# Patient Record
Sex: Female | Born: 1960 | Race: Black or African American | Hispanic: No | Marital: Single | Smoking: Never smoker
Health system: Southern US, Community
[De-identification: ages and names within clinical notes are randomized; demographics above are authoritative.]

## PROBLEM LIST (undated history)

## (undated) HISTORY — PX: ABLATION: SHX5711

## (undated) HISTORY — PX: HEMORRHOID SURGERY: SHX153

---

## 1998-12-06 ENCOUNTER — Other Ambulatory Visit: Admission: RE | Admit: 1998-12-06 | Discharge: 1998-12-06 | Payer: Self-pay | Admitting: Obstetrics and Gynecology

## 2002-01-07 ENCOUNTER — Inpatient Hospital Stay (HOSPITAL_COMMUNITY): Admission: AD | Admit: 2002-01-07 | Discharge: 2002-01-09 | Payer: Self-pay | Admitting: Internal Medicine

## 2002-02-05 ENCOUNTER — Other Ambulatory Visit: Admission: RE | Admit: 2002-02-05 | Discharge: 2002-02-05 | Payer: Self-pay | Admitting: Obstetrics & Gynecology

## 2003-01-29 ENCOUNTER — Emergency Department (HOSPITAL_COMMUNITY): Admission: EM | Admit: 2003-01-29 | Discharge: 2003-01-29 | Payer: Self-pay | Admitting: Emergency Medicine

## 2003-02-04 ENCOUNTER — Other Ambulatory Visit: Admission: RE | Admit: 2003-02-04 | Discharge: 2003-02-04 | Payer: Self-pay | Admitting: Obstetrics and Gynecology

## 2004-04-19 ENCOUNTER — Other Ambulatory Visit: Admission: RE | Admit: 2004-04-19 | Discharge: 2004-04-19 | Payer: Self-pay | Admitting: Obstetrics and Gynecology

## 2006-11-22 ENCOUNTER — Encounter (INDEPENDENT_AMBULATORY_CARE_PROVIDER_SITE_OTHER): Payer: Self-pay | Admitting: Obstetrics and Gynecology

## 2006-11-22 ENCOUNTER — Ambulatory Visit (HOSPITAL_COMMUNITY): Admission: RE | Admit: 2006-11-22 | Discharge: 2006-11-22 | Payer: Self-pay | Admitting: Obstetrics and Gynecology

## 2008-09-23 ENCOUNTER — Encounter: Admission: RE | Admit: 2008-09-23 | Discharge: 2008-09-23 | Payer: Self-pay | Admitting: Obstetrics and Gynecology

## 2009-04-20 ENCOUNTER — Ambulatory Visit (HOSPITAL_BASED_OUTPATIENT_CLINIC_OR_DEPARTMENT_OTHER): Admission: RE | Admit: 2009-04-20 | Discharge: 2009-04-20 | Payer: Self-pay | Admitting: General Surgery

## 2010-04-11 ENCOUNTER — Encounter: Payer: Self-pay | Admitting: Obstetrics and Gynecology

## 2010-06-09 LAB — DIFFERENTIAL
Basophils Absolute: 0.1 10*3/uL (ref 0.0–0.1)
Basophils Relative: 2 % — ABNORMAL HIGH (ref 0–1)
Eosinophils Absolute: 0.2 10*3/uL (ref 0.0–0.7)
Eosinophils Relative: 2 % (ref 0–5)
Lymphocytes Relative: 19 % (ref 12–46)
Lymphs Abs: 1.6 10*3/uL (ref 0.7–4.0)
Monocytes Absolute: 0.6 10*3/uL (ref 0.1–1.0)
Monocytes Relative: 6 % (ref 3–12)
Neutro Abs: 6.2 10*3/uL (ref 1.7–7.7)
Neutrophils Relative %: 71 % (ref 43–77)

## 2010-06-09 LAB — CBC
HCT: 38.9 % (ref 36.0–46.0)
Hemoglobin: 13.3 g/dL (ref 12.0–15.0)
MCHC: 34.1 g/dL (ref 30.0–36.0)
MCV: 90.7 fL (ref 78.0–100.0)
Platelets: 197 10*3/uL (ref 150–400)
RBC: 4.29 MIL/uL (ref 3.87–5.11)
RDW: 12.9 % (ref 11.5–15.5)
WBC: 8.7 10*3/uL (ref 4.0–10.5)

## 2010-06-09 LAB — POCT PREGNANCY, URINE: Preg Test, Ur: NEGATIVE

## 2010-08-02 NOTE — Op Note (Signed)
Hailey Henry, [place] NO.:  [id number]   MEDICAL RECORD NO.:  [id number]          PATIENT TYPE:  AMB   LOCATION:  SDC                           FACILITY:  [facility]   PHYSICIAN:  Malva Limes, M.D.    DATE OF BIRTH:  [birth year]   DATE OF PROCEDURE:  11/22/2006  DATE OF DISCHARGE:                               OPERATIVE REPORT   PREOPERATIVE DIAGNOSIS:  Menorrhagia.   POSTOPERATIVE DIAGNOSIS:  Menorrhagia.   PROCEDURE:  1. Dilatation and curettage.  2. NovaSure endometrial ablation.   SURGEON:  Malva Limes, M.D.   ANESTHESIA:  General.   ANTIBIOTICS:  Ancef 1 gram.   DRAINS:  Straight cath to bladder.   ESTIMATED BLOOD LOSS:  Minimal.   SPECIMEN:  Endometrial curetting sent to Pathology.   COMPLICATIONS:  None.   ESTIMATED BLOOD LOSS:  Minimal.   PROCEDURE IN DETAIL:  The patient was taken to the operating room where  she was placed in the dorsal supine position and a general anesthetic  was administered without complication.  She was then placed in the  dorsal lithotomy position, she was prepped with Betadine and draped in  the usual fashion for this procedure.  Her bladder was drained with a  red rubber catheter.  The patient had an exam under anesthesia which  revealed a normal size uterus that was anteverted.  A sterile speculum  was placed in the vagina and 20 mL of 1% lidocaine was used for a  paracervical block.  The cervix was grasped with a single tooth  tenaculum and dilated to a 27-French.  The uterus was sounded to 8 cm.  The endocervical length was measured at 2 cm giving an intracavitary  length of 6 cm.  At this point sharp curettage was performed, the  NovaSure device was then placed into the uterine cavity and opened.  The  width was 4 cm.  At this point the seal test was performed and passed.  The device was then turned on for a total of 2  minutes at 90 watts.  This concluded the procedure.  The device was  removed.  The patient  tolerated the procedure well.  She will be  discharged to home.  She was sent home with Percocet to take p.r.n.  She  will follow-up in the office in 4 weeks.           ______________________________  Malva Limes, M.D.     MA/MEDQ  D:  11/22/2006  T:  11/22/2006  Job:  [id number]

## 2010-08-05 NOTE — Discharge Summary (Signed)
   Hailey Henry, Hailey Henry                         ACCOUNT NO.:  [id number]   MEDICAL RECORD NO.:  [id number]                   PATIENT TYPE:  INP   LOCATION:  9120                                 FACILITY:  [facility]   PHYSICIAN:  Gerrit Friends. Aldona Bar, M.D.                DATE OF BIRTH:  Jul 11, 1960   DATE OF ADMISSION:  01/07/2002  DATE OF DISCHARGE:  01/09/2002                                 DISCHARGE SUMMARY   DISCHARGE DIAGNOSES:  1. Term pregnancy, delivered 8 pound 6 ounce female infant, Apgars 8 and 9.  2. Blood type O positive.  3. Positive group B strep antenatally.  4. Advanced maternal age - amniocentesis at 16 weeks - normal.  5. Iron deficiency anemia.   PROCEDURES:  1. Normal spontaneous delivery.  2. Second degree tear and repair.   SUMMARY:  This 50 year old gravida 2 para 1 with a due date of December 31, 2001 presented in labor after a pregnancy complicated by advanced maternal  age (normal amniocentesis at [redacted] weeks gestation) and a history of iron  deficiency anemia.  She also was positive for group B strep antenatally.   She was admitted, had a normal course of labor, requested and received an  epidural, and had a subsequent normal spontaneous delivery of a viable  female 8 pound 6 ounce female infant with Apgars of 8 and 9 over a second  degree tear which was repaired without difficulty.  There was some lightly  meconium stained amniotic fluid noted.  The baby was DeLee'd on the  perineum.  The patient's postpartum course was normal.  She was  breastfeeding without difficulty.  Discharge hemoglobin 10.6 with a white  count of 14,100 and a platelet count of 187,000.  On the morning of October  23 she was ambulating well, tolerating a regular diet well, having normal  bowel and bladder function, was afebrile.  Her breastfeeding was going well.  She was tolerating a regular diet well, vital signs were stable, and she was  ready for discharge.  Accordingly, she was given all  appropriate  instructions and understood all instructions well.   DISCHARGE MEDICATIONS:  1. Vitamins - one a day.  2. Ferrous sulfate 300 mg daily.  3. Motrin 600 mg q.6h. p.r.n. for pain.   FOLLOW-UP:  She will return to the office for follow-up in approximately  four weeks time.   CONDITION ON DISCHARGE:  Improved.                                               Gerrit Friends. Aldona Bar, M.D.    RMW/MEDQ  D:  01/09/2002  T:  01/09/2002  Job:  [id number]

## 2010-09-19 ENCOUNTER — Other Ambulatory Visit: Payer: Self-pay | Admitting: Obstetrics and Gynecology

## 2010-12-05 ENCOUNTER — Other Ambulatory Visit: Payer: Self-pay | Admitting: Obstetrics and Gynecology

## 2010-12-30 LAB — CBC
HCT: 26.6 — ABNORMAL LOW
Hemoglobin: 8.5 — ABNORMAL LOW
MCHC: 31.9
MCV: 65.7 — ABNORMAL LOW
Platelets: 300
RBC: 4.04
RDW: 19.4 — ABNORMAL HIGH
WBC: 6.1

## 2010-12-30 LAB — PREGNANCY, URINE: Preg Test, Ur: NEGATIVE

## 2012-12-31 ENCOUNTER — Other Ambulatory Visit: Payer: Self-pay | Admitting: Obstetrics and Gynecology

## 2014-02-03 ENCOUNTER — Other Ambulatory Visit: Payer: Self-pay | Admitting: Obstetrics and Gynecology

## 2014-02-04 LAB — CYTOLOGY - PAP

## 2014-11-14 ENCOUNTER — Observation Stay (HOSPITAL_COMMUNITY)
Admission: EM | Admit: 2014-11-14 | Discharge: 2014-11-15 | Disposition: A | Discharge status: Home / Self Care | Payer: BLUE CROSS/BLUE SHIELD | Attending: Internal Medicine | Admitting: Internal Medicine

## 2014-11-14 ENCOUNTER — Encounter (HOSPITAL_COMMUNITY): Payer: Self-pay | Admitting: Emergency Medicine

## 2014-11-14 ENCOUNTER — Emergency Department (HOSPITAL_COMMUNITY): Payer: BLUE CROSS/BLUE SHIELD

## 2014-11-14 DIAGNOSIS — Z8249 Family history of ischemic heart disease and other diseases of the circulatory system: Secondary | ICD-10-CM | POA: Insufficient documentation

## 2014-11-14 DIAGNOSIS — E785 Hyperlipidemia, unspecified: Secondary | ICD-10-CM | POA: Insufficient documentation

## 2014-11-14 DIAGNOSIS — R0789 Other chest pain: Secondary | ICD-10-CM | POA: Diagnosis not present

## 2014-11-14 DIAGNOSIS — R739 Hyperglycemia, unspecified: Secondary | ICD-10-CM | POA: Insufficient documentation

## 2014-11-14 DIAGNOSIS — R079 Chest pain, unspecified: Principal | ICD-10-CM | POA: Diagnosis present

## 2014-11-14 DIAGNOSIS — R6884 Jaw pain: Secondary | ICD-10-CM | POA: Diagnosis present

## 2014-11-14 DIAGNOSIS — Z6836 Body mass index (BMI) 36.0-36.9, adult: Secondary | ICD-10-CM | POA: Diagnosis not present

## 2014-11-14 DIAGNOSIS — E668 Other obesity: Secondary | ICD-10-CM | POA: Insufficient documentation

## 2014-11-14 LAB — BASIC METABOLIC PANEL
Anion gap: 5 (ref 5–15)
BUN: 10 mg/dL (ref 6–20)
CHLORIDE: 108 mmol/L (ref 101–111)
CO2: 26 mmol/L (ref 22–32)
Calcium: 9.1 mg/dL (ref 8.9–10.3)
Creatinine, Ser: 0.63 mg/dL (ref 0.44–1.00)
GFR calc Af Amer: >60 mL/min (ref >60)
Glucose, Bld: 85 mg/dL (ref 65–99)
POTASSIUM: 3.8 mmol/L (ref 3.5–5.1)
Sodium: 139 mmol/L (ref 135–145)

## 2014-11-14 LAB — CBC
HCT: 38.8 % (ref 36.0–46.0)
Hemoglobin: 12.8 g/dL (ref 12.0–15.0)
MCH: 29.8 pg (ref 26.0–34.0)
MCHC: 33 g/dL (ref 30.0–36.0)
MCV: 90.2 fL (ref 78.0–100.0)
Platelets: 257 10*3/uL (ref 150–400)
RBC: 4.3 MIL/uL (ref 3.87–5.11)
RDW: 13.7 % (ref 11.5–15.5)
WBC: 6.3 10*3/uL (ref 4.0–10.5)

## 2014-11-14 LAB — TROPONIN I

## 2014-11-14 LAB — I-STAT TROPONIN, ED: Troponin i, poc: 0 ng/mL (ref 0.00–0.08)

## 2014-11-14 MED ORDER — ADULT MULTIVITAMIN W/MINERALS CH
1.0000 | ORAL_TABLET | Freq: Every day | ORAL | Status: DC
Start: 2014-11-14 — End: 2014-11-15
  Administered 2014-11-14 – 2014-11-15 (×2): 1 via ORAL
  Filled 2014-11-14 (×2): qty 1

## 2014-11-14 MED ORDER — GI COCKTAIL ~~LOC~~: 30.0000 mL | Freq: Four times a day (QID) | ORAL | Status: DC | PRN

## 2014-11-14 MED ORDER — ONDANSETRON HCL 4 MG/2ML IJ SOLN
4.0000 mg | Freq: Four times a day (QID) | INTRAMUSCULAR | Status: DC | PRN
  Administered 2014-11-15: 4 mg via INTRAVENOUS

## 2014-11-14 MED ORDER — ASPIRIN EC 325 MG PO TBEC
325.0000 mg | DELAYED_RELEASE_TABLET | Freq: Every day | ORAL | Status: DC
  Administered 2014-11-14 – 2014-11-15 (×2): 325 mg via ORAL
  Filled 2014-11-14 (×2): qty 1

## 2014-11-14 MED ORDER — ACETAMINOPHEN 325 MG PO TABS
650.0000 mg | ORAL_TABLET | ORAL | Status: DC | PRN
  Administered 2014-11-15: 650 mg via ORAL

## 2014-11-14 MED ORDER — MORPHINE SULFATE (PF) 2 MG/ML IV SOLN: 2.0000 mg | INTRAVENOUS | Status: DC | PRN

## 2014-11-14 MED ORDER — ONDANSETRON HCL 4 MG/2ML IJ SOLN: 4.0000 mg | Freq: Three times a day (TID) | INTRAMUSCULAR | Status: AC | PRN

## 2014-11-14 MED ORDER — NITROGLYCERIN 0.4 MG SL SUBL: 0.4000 mg | SUBLINGUAL_TABLET | SUBLINGUAL | Status: DC | PRN

## 2014-11-14 MED ORDER — ENOXAPARIN SODIUM 40 MG/0.4ML ~~LOC~~ SOLN
40.0000 mg | SUBCUTANEOUS | Status: DC
  Administered 2014-11-14: 40 mg via SUBCUTANEOUS
  Filled 2014-11-14: qty 0.4

## 2014-11-14 NOTE — H&P (Signed)
History and Physical        Hospital Admission Note Date: 11/14/2014  Patient name: Hailey Henry Medical record number: [id number] Date of birth: [birth year] Age: 54 y.o. Gender: female  PCP: No primary care provider on file.  Referring physician: Dr Hyacinth Meeker  Chief Complaint:  Chest pain radiating to the jaw and the left arm x2 days  HPI:  Patient is a 54 year old female with no significant past medical problems on no daily prescription medications presented with midsternal chest pain radiating to the jaw and the left arm since yesterday. History was obtained from the patient who reported that she was walking in the [facility] parking lot yesterday after visiting her mother (who is receiving chemotherapy there) when she noticed midsternal chest pain, described as heaviness, radiating to her jaw and left arm. She also noticed heaviness in her left arm with numbness and tingling with the chest pain. Patient however drove home and the chest pain was spontaneously relieved. This morning while she was driving to the [facility] again to visit her mother, she noticed the symptoms returning and she came back to the [facility] ED for evaluation. During the encounter, patient mentioned that she has noticed intermittent chest heaviness since January, sometimes at rest, at times with exertion, spontaneously resolves. She denied any symptoms of belching or burping or any acid reflux. Patient had no prior cardiac workup done. She denied any coughing, shortness of breath, palpitations or diaphoresis, nausea or vomiting.  Patient reported history of massive heart attack in her aunt and uncle (high father's brother and sister) in their 56s, father had stroke. At the time of my evaluation, chest pain had resolved. Troponins negative so far, EKG did not show acute ST-T wave changes suggestive  of ischemia    Review of Systems:  Constitutional: Denies fever, chills, diaphoresis, poor appetite and fatigue.  HEENT: Denies photophobia, eye pain, redness, hearing loss, ear pain, congestion, sore throat, rhinorrhea, sneezing, mouth sores, trouble swallowing, neck pain, neck stiffness and tinnitus.   Respiratory: Denies SOB, DOE, cough, and wheezing.   Cardiovascular: Please see history of present illness Gastrointestinal: Denies nausea, vomiting, abdominal pain, diarrhea, constipation, blood in stool and abdominal distention.  Genitourinary: Denies dysuria, urgency, frequency, hematuria, flank pain and difficulty urinating.  Musculoskeletal: Denies myalgias, back pain, joint swelling, arthralgias and gait problem.  Skin: Denies pallor, rash and wound.  Neurological: Denies dizziness, seizures, syncope, weakness, light-headedness, numbness and headaches.  Hematological: Denies adenopathy. Easy bruising, personal or family bleeding history  Psychiatric/Behavioral: Denies suicidal ideation, mood changes, confusion, nervousness, sleep disturbance and agitation  Past Medical History: History reviewed. No pertinent past medical history.  Past Surgical History  Procedure Laterality Date  . Hemorrhoid surgery    . Ablation      Medications: Prior to Admission medications   Medication Sig Start Date End Date Taking? Authorizing Provider  Multiple Vitamin (MULTIVITAMIN WITH MINERALS) TABS tablet Take 1 tablet by mouth daily.   Yes Historical Provider, MD    Allergies:   Allergies  Allergen Reactions  . Shellfish Allergy Anaphylaxis    Social History:  reports that she has never smoked. She does not have any  smokeless tobacco history on file. She reports that she does not drink alcohol. Her drug history is not on file.  Family History: Patient reports that her aunt (father's sister), uncle (father's brother) but had a massive heart attack in their 28s.  Her father had the stroke  at the age of 42  Physical Exam: Blood pressure 129/76, pulse 67, temperature 98.2 F (36.8 C), temperature source Oral, resp. rate 18, height 5\' 9"  (1.753 m), weight 113.399 kg (250 lb), SpO2 100 %. General: Alert, awake, oriented x3, in no acute distress. HEENT: normocephalic, atraumatic, anicteric sclera, pink conjunctiva, pupils equal and reactive to light and accomodation, oropharynx clear Neck: supple, no masses or lymphadenopathy, no goiter, no bruits  Heart: Regular rate and rhythm, without murmurs, rubs or gallops. No chest wall tenderness. Lungs: Clear to auscultation bilaterally, no wheezing, rales or rhonchi. Abdomen: Soft, nontender, nondistended, positive bowel sounds, no masses. Extremities: No clubbing, cyanosis or edema with positive pedal pulses. Neuro: Grossly intact, no focal neurological deficits, strength 5/5 upper and lower extremities bilaterally Psych: alert and oriented x 3, normal mood and affect Skin: no rashes or lesions, warm and dry   LABS on Admission:  Basic Metabolic Panel:  Recent Labs Lab 11/14/14 1400  NA 139  K 3.8  CL 108  CO2 26  GLUCOSE 85  BUN 10  CREATININE 0.63  CALCIUM 9.1   Liver Function Tests: No results for input(s): AST, ALT, ALKPHOS, BILITOT, PROT, ALBUMIN in the last 168 hours. No results for input(s): LIPASE, AMYLASE in the last 168 hours. No results for input(s): AMMONIA in the last 168 hours. CBC:  Recent Labs Lab 11/14/14 1400  WBC 6.3  HGB 12.8  HCT 38.8  MCV 90.2  PLT 257   Cardiac Enzymes: No results for input(s): CKTOTAL, CKMB, CKMBINDEX, TROPONINI in the last 168 hours. BNP: Invalid input(s): POCBNP CBG: No results for input(s): GLUCAP in the last 168 hours.  Radiological Exams on Admission:  Dg Chest 2 View  11/14/2014   CLINICAL DATA:  Left side jaw pain occasional earlier this week. Left arm pain and numbness since last night. Chest pressure this week.  EXAM: CHEST  2 VIEW  COMPARISON:  None.   FINDINGS: Heart is upper limits normal in size. Lungs are clear. No effusions. Rightward scoliosis in the thoracic spine. No acute bony abnormality.  IMPRESSION: No active cardiopulmonary disease.   Electronically Signed   By: Charlett Nose M.D.   On: 11/14/2014 14:20    *I have personally reviewed the images above*  EKG: Independently reviewed. Rate 70, RSR in pattern, left atrial enlargement, no acute ST-T wave changes suggestive of ischemia   Assessment/Plan Principal Problem:   Atypical chest pain: No history of hypertension, hyperlipidemia or diabetes, although has a strong family history of CAD. Nonsmoker. No prior cardiac workup.  - Admit to telemetry for observation - Obtain serial cardiac enzymes, lipid panel - Placed on aspirin, nitroglycerin sublingual as needed.  - Discussed in detail with cardiology fellow on call, Dr Loney Loh, recommended to obtain stress echo with the treadmill tomorrow. If negative, will DC home. NPO after midnight.   DVT prophylaxis: Lovenox  CODE STATUS: Full code  Family Communication: Admission, patients condition and plan of care including tests being ordered have been discussed with the patient  who indicates understanding and agree with the plan and Code Status  Disposition plan: Further plan will depend as patient's clinical course evolves and further radiologic and laboratory data become available.  Time Spent on Admission:   Jobani Sabado M.D. [facility] 11/14/2014, 5:11 PM Pager: [phone]  If 7PM-7AM, please contact night-coverage [email or web address] Password [email or web address]

## 2014-11-14 NOTE — ED Provider Notes (Signed)
CSN: [id number]     Arrival date & time 11/14/14  1334 History   First MD Initiated Contact with Patient 11/14/14 1501     Chief Complaint  Patient presents with  . Jaw Pain  . Arm Pain     (Consider location/radiation/quality/duration/timing/severity/associated sxs/prior Treatment) HPI Comments: CP, history of no significant past medical problems, she takes no daily prescription medications. She complains of 2 days of intermittent pain in her left arm, heaviness on her chest and now jaw pain. This is persistent this evening, it had resolved after it started last night and she woke up this morning without pain. She describes the pain in her chest as a heaviness, it does not seem to be exertional but the patient does not exercise and so she is unaware if she has exertional symptoms. She has been visiting her mother in the hospital who is currently getting chemotherapy for leukemia, her symptoms seem to come on at rest. She does not smoke cigarettes, she does have a family history of heart disease including an uncle and an aunt who died of heart disease and another uncle who had a stroke in their 42s. Her symptoms are still present, they are mild. There is no shortness of breath fevers chills coughing swelling of the legs headache changes in vision or any other complaints.  Patient is a 54 y.o. female presenting with arm pain. The history is provided by the patient.  Arm Pain    History reviewed. No pertinent past medical history. Past Surgical History  Procedure Laterality Date  . Hemorrhoid surgery    . Ablation     No family history on file. Social History  Substance Use Topics  . Smoking status: Never Smoker   . Smokeless tobacco: None  . Alcohol Use: No   OB History    No data available     Review of Systems  All other systems reviewed and are negative.     Allergies  Shellfish allergy  Home Medications   Prior to Admission medications   Medication Sig Start Date End  Date Taking? Authorizing Provider  Multiple Vitamin (MULTIVITAMIN WITH MINERALS) TABS tablet Take 1 tablet by mouth daily.   Yes Historical Provider, MD   BP 129/76 mmHg  Pulse 67  Temp(Src) 98.2 F (36.8 C) (Oral)  Resp 18  Ht  (1.753 m)  Wt 250 lb (113.399 kg)  BMI 36.90 kg/m2  SpO2 100% Physical Exam  Constitutional: She appears well-developed and well-nourished. No distress.  HENT:  Head: Normocephalic and atraumatic.  Mouth/Throat: Oropharynx is clear and moist. No oropharyngeal exudate.  Eyes: Conjunctivae and EOM are normal. Pupils are equal, round, and reactive to light. Right eye exhibits no discharge. Left eye exhibits no discharge. No scleral icterus.  Neck: Normal range of motion. Neck supple. No JVD present. No thyromegaly present.  Cardiovascular: Normal rate, regular rhythm, normal heart sounds and intact distal pulses.  Exam reveals no gallop and no friction rub.   No murmur heard. Pulmonary/Chest: Effort normal and breath sounds normal. No respiratory distress. She has no wheezes. She has no rales.  Abdominal: Soft. Bowel sounds are normal. She exhibits no distension and no mass. There is no tenderness.  Musculoskeletal: Normal range of motion. She exhibits no edema or tenderness.  Lymphadenopathy:    She has no cervical adenopathy.  Neurological: She is alert. Coordination normal.  Skin: Skin is warm and dry. No rash noted. No erythema.  Psychiatric: She has a normal mood and  affect. Her behavior is normal.  Nursing note and vitals reviewed.   ED Course  Procedures (including critical care time) Labs Review Labs Reviewed  BASIC METABOLIC PANEL  CBC  I-STAT TROPOININ, ED    Imaging Review Dg Chest 2 View  11/14/2014   CLINICAL DATA:  Left side jaw pain occasional earlier this week. Left arm pain and numbness since last night. Chest pressure this week.  EXAM: CHEST  2 VIEW  COMPARISON:  None.  FINDINGS: Heart is upper limits normal in size. Lungs are  clear. No effusions. Rightward scoliosis in the thoracic spine. No acute bony abnormality.  IMPRESSION: No active cardiopulmonary disease.   Electronically Signed   By: Charlett Nose M.D.   On: 11/14/2014 14:20   I have personally reviewed and evaluated these images and lab results as part of my medical decision-making.   EKG Interpretation   Date/Time:  Saturday November 14 2014 13:45:09 EDT Ventricular Rate:  70 PR Interval:  167 QRS Duration: 102 QT Interval:  404 QTC Calculation: 436 R Axis:   35 Text Interpretation:  Sinus rhythm Probable left atrial enlargement RSR'  in V1 or V2, probably normal variant since last tracing no significant  change Confirmed by Madelline Eshbach  MD, Xenia Nile ([id number]) on 11/14/2014 3:05:18 PM      MDM   Final diagnoses:  Chest pain, unspecified chest pain type    The patient has normal vital signs, the EKG is unremarkable since the last one several years ago. The vital signs show no hypertension of any concern, no fever, no tachycardia. I am concerned for her ongoing symptoms and she does not appear to be stressed or anxious. Labs show normal troponin, symptoms have been going on for only several hours, I have made my medical recommendation that she needs to be admitted to the hospital for observation and further evaluation with a potential lab work and a stress test. The patient is hesitant to do this as she is the sole caregiver of her 45 year old child who is currently at the neighbor's house. The patient did take a full aspirin yesterday as well as this morning when she woke up.  Discussed all labs and imaging with the patient, she is agreeable to admission  Discussed with the hospitalist who will see the patient in the emergency Department immediately and requests holding orders.  I have personally viewed and interpreted the imaging and agree with radiologist interpretation.   Meds given in ED:  Medications  nitroGLYCERIN (NITROSTAT) SL tablet 0.4 mg (not  administered)    New Prescriptions   No medications on file        Eber Hong, MD 11/14/14 1651

## 2014-11-14 NOTE — ED Notes (Signed)
Pt states that jaw pain couple days ago. Pt states that yesterday she started having left arm pain and numbness.  Pt states that she went home from visiting her mother.  Pt states that she started towards [facility] to visit her mother again when the jaw pain and arm pain started again.

## 2014-11-15 ENCOUNTER — Ambulatory Visit (HOSPITAL_COMMUNITY)
Admit: 2014-11-15 | Discharge: 2014-11-15 | Disposition: A | Discharge status: Home / Self Care | Payer: BLUE CROSS/BLUE SHIELD | Source: Other Acute Inpatient Hospital | Attending: Internal Medicine | Admitting: Internal Medicine

## 2014-11-15 ENCOUNTER — Observation Stay (HOSPITAL_COMMUNITY)
Admit: 2014-11-15 | Discharge: 2014-11-15 | Disposition: A | Discharge status: Home / Self Care | Payer: BLUE CROSS/BLUE SHIELD | Attending: Internal Medicine | Admitting: Internal Medicine

## 2014-11-15 DIAGNOSIS — R6884 Jaw pain: Secondary | ICD-10-CM | POA: Insufficient documentation

## 2014-11-15 DIAGNOSIS — R079 Chest pain, unspecified: Secondary | ICD-10-CM | POA: Insufficient documentation

## 2014-11-15 DIAGNOSIS — M79602 Pain in left arm: Secondary | ICD-10-CM | POA: Diagnosis not present

## 2014-11-15 DIAGNOSIS — E785 Hyperlipidemia, unspecified: Secondary | ICD-10-CM | POA: Diagnosis not present

## 2014-11-15 DIAGNOSIS — R0789 Other chest pain: Secondary | ICD-10-CM | POA: Diagnosis not present

## 2014-11-15 LAB — BASIC METABOLIC PANEL
Anion gap: 8 (ref 5–15)
BUN: 11 mg/dL (ref 6–20)
CO2: 23 mmol/L (ref 22–32)
Calcium: 8.7 mg/dL — ABNORMAL LOW (ref 8.9–10.3)
Chloride: 106 mmol/L (ref 101–111)
Creatinine, Ser: 0.58 mg/dL (ref 0.44–1.00)
GFR calc Af Amer: >60 mL/min (ref >60)
GLUCOSE: 104 mg/dL — AB (ref 65–99)
POTASSIUM: 3.4 mmol/L — AB (ref 3.5–5.1)
Sodium: 137 mmol/L (ref 135–145)

## 2014-11-15 LAB — LIPID PANEL
CHOLESTEROL: 201 mg/dL — AB (ref 0–200)
HDL: 56 mg/dL (ref >40)
LDL CALC: 124 mg/dL — AB (ref 0–99)
TRIGLYCERIDES: 107 mg/dL (ref <150)
Total CHOL/HDL Ratio: 3.6 RATIO
VLDL: 21 mg/dL (ref 0–40)

## 2014-11-15 LAB — CBC
HEMATOCRIT: 35.3 % — AB (ref 36.0–46.0)
Hemoglobin: 11.7 g/dL — ABNORMAL LOW (ref 12.0–15.0)
MCH: 29.8 pg (ref 26.0–34.0)
MCHC: 33.1 g/dL (ref 30.0–36.0)
MCV: 90.1 fL (ref 78.0–100.0)
Platelets: 252 10*3/uL (ref 150–400)
RBC: 3.92 MIL/uL (ref 3.87–5.11)
RDW: 13.5 % (ref 11.5–15.5)
WBC: 7.4 10*3/uL (ref 4.0–10.5)

## 2014-11-15 LAB — TROPONIN I: Troponin I: <0.03 ng/mL (ref <0.031)

## 2014-11-15 MED ORDER — SIMVASTATIN 10 MG PO TABS
10.0000 mg | ORAL_TABLET | Freq: Every day | ORAL | Status: DC
Start: 2014-11-15 — End: 2014-11-15

## 2014-11-15 MED ORDER — TECHNETIUM TC 99M SESTAMIBI - CARDIOLITE
30.0000 | Freq: Once | INTRAVENOUS | Status: AC | PRN
  Administered 2014-11-15: 30 via INTRAVENOUS

## 2014-11-15 MED ORDER — REGADENOSON 0.4 MG/5ML IV SOLN
0.4000 mg | Freq: Once | INTRAVENOUS | Status: AC
  Administered 2014-11-15: 0.4 mg via INTRAVENOUS
  Filled 2014-11-15: qty 5

## 2014-11-15 MED ORDER — SIMVASTATIN 10 MG PO TABS: 10.0000 mg | ORAL_TABLET | Freq: Every day | ORAL | Status: DC

## 2014-11-15 MED ORDER — PANTOPRAZOLE SODIUM 40 MG PO TBEC: 40.0000 mg | DELAYED_RELEASE_TABLET | Freq: Every day | ORAL | Status: DC

## 2014-11-15 MED ORDER — TECHNETIUM TC 99M SESTAMIBI GENERIC - CARDIOLITE
10.3800 | Freq: Once | INTRAVENOUS | Status: AC | PRN
  Administered 2014-11-15: 10.38 via INTRAVENOUS

## 2014-11-15 MED ORDER — REGADENOSON 0.4 MG/5ML IV SOLN
INTRAVENOUS | Status: AC
  Filled 2014-11-15: qty 5

## 2014-11-15 MED ORDER — ONDANSETRON HCL 4 MG/2ML IJ SOLN
INTRAMUSCULAR | Status: AC
  Filled 2014-11-15: qty 2

## 2014-11-15 MED ORDER — ACETAMINOPHEN 325 MG PO TABS
ORAL_TABLET | ORAL | Status: AC
  Filled 2014-11-15: qty 2

## 2014-11-15 NOTE — Consult Note (Signed)
CARDIOLOGY CONSULT NOTE  Patient ID: Hailey Henry MRN: [id number] DOB/AGE: 1960/10/09 54 y.o.  Admit date: 11/14/2014 Referring Physician  Ripu Rai, MD Primary Physician:  Evert Kohl, MD Reason for Consultation  Chest pain  HPI: Hailey Henry  is a 54 y.o. female  With hyperlipidemia (mild), with family history of CADn in paternal uncles and father with stroke in late 90 years of age admitted with chest pain that started a few weeks ago with exertion. Described as mild previously. Arna Medici was driving to [place], had chest with radiation oto the jaw and also arms. She presented to [facility] ED where she was admitted for observation.  No further chest pain, she denies any shortness of breath. No leg edema painful swelling of lower asked him days. No hemoptysis, dizziness or syncope.  History reviewed. No pertinent past medical history, except for mild hyperlipidemia. She does have history of migraine headaches, this morning had an episode of migraine with mild nausea.  Past Surgical History  Procedure Laterality Date  . Hemorrhoid surgery    . Ablation       History reviewed. No pertinent family history.   Social History: Social History   Social History  . Marital Status: Single    Spouse Name: N/A  . Number of Children: N/A  . Years of Education: N/A   Occupational History  . Not on file.   Social History Main Topics  . Smoking status: Never Smoker   . Smokeless tobacco: Not on file  . Alcohol Use: No  . Drug Use: Not on file  . Sexual Activity: Not on file   Other Topics Concern  . Not on file   Social History Narrative  . No narrative on file     Prescriptions prior to admission  Medication Sig Dispense Refill Last Dose  . Multiple Vitamin (MULTIVITAMIN WITH MINERALS) TABS tablet Take 1 tablet by mouth daily.   11/13/2014 at Unknown time     ROS: General: no fevers/chills/night sweats Eyes: no blurry vision, diplopia, or amaurosis ENT: no sore throat or  hearing loss Resp: no cough, wheezing, or hemoptysis CV: no edema or palpitations GI: no abdominal pain,  diarrhea, or constipation GU: no dysuria, frequency, or hematuria Skin: no rash Neuro:  Headache/migraine present. No numbness, tingling, or weakness of extremities Musculoskeletal: no joint pain or swelling Heme: no bleeding, DVT, or easy bruising Endo: no polydipsia or polyuria    Physical Exam: Blood pressure 122/77, pulse 68, temperature 97.6 F (36.4 C), temperature source Oral, resp. rate 18, height 5\' 9"  (1.753 m), weight 110.7 kg (244 lb 0.8 oz), SpO2 98 %. Body mass index is 36.02 kg/(m^2).  General appearance: alert, cooperative, appears stated age, no distress and moderately obese Lungs: clear to auscultation bilaterally Chest wall: no tenderness Heart: regular rate and rhythm, S1, S2 normal, no murmur, click, rub or gallop Abdomen: soft, non-tender; bowel sounds normal; no masses,  no organomegaly Extremities: extremities normal, atraumatic, no cyanosis or edema Pulses: 2+ and symmetric Neurologic: Grossly normal  Labs:   Lab Results  Component Value Date   WBC 7.4 11/15/2014   HGB 11.7* 11/15/2014   HCT 35.3* 11/15/2014   MCV 90.1 11/15/2014   PLT 252 11/15/2014    Recent Labs Lab 11/15/14 0108  NA 137  K 3.4*  CL 106  CO2 23  BUN 11  CREATININE 0.58  CALCIUM 8.7*  GLUCOSE 104*    Lipid Panel     Component Value Date/Time  CHOL 201* 11/14/2014 1900   TRIG 107 11/14/2014 1900   HDL 56 11/14/2014 1900   CHOLHDL 3.6 11/14/2014 1900   VLDL 21 11/14/2014 1900   LDLCALC 124* 11/14/2014 1900   Cardiac Panel (last 3 results)  Recent Labs  11/14/14 1900 11/14/14 2208 11/15/14 0108  TROPONINI <0.03 <0.03 <0.03    Lab Results  Component Value Date   TROPONINI <0.03 11/15/2014     TSH No results for input(s): TSH in the last 8760 hours.  EKG 11/14/2014: Normal sinus rhythm, normal axis. No evidence of ischemia, poor RV progression  probably normal variant.   Radiology: Dg Chest 2 View  11/14/2014   CLINICAL DATA:  Left side jaw pain occasional earlier this week. Left arm pain and numbness since last night. Chest pressure this week.  EXAM: CHEST  2 VIEW  COMPARISON:  None.  FINDINGS: Heart is upper limits normal in size. Lungs are clear. No effusions. Rightward scoliosis in the thoracic spine. No acute bony abnormality.  IMPRESSION: No active cardiopulmonary disease.   Electronically Signed   By: Charlett Nose M.D.   On: 11/14/2014 14:20    Scheduled Meds: . aspirin EC  325 mg Oral Daily  . enoxaparin (LOVENOX) injection  40 mg Subcutaneous Q24H  . multivitamin with minerals  1 tablet Oral Daily   Continuous Infusions:  PRN Meds:.acetaminophen, gi cocktail, morphine injection, nitroGLYCERIN, ondansetron (ZOFRAN) IV  ASSESSMENT AND PLAN:  1. Chest pain suggestive of angina pectoris with radiation to the jaw and bilateral extremities 2. Hyperlipidemia 3. Moderate obesity 4. Hyperglycemia  Recommendation: Patient needs cardiac risk stratification, agree with proceeding with stress testing. Due to headache and nausea, she will receive Lexi scan sestamibi stress test. If low risk, she can be discharged home, with aggressive primary prevention strategy. I have discussed this with the patient. However if the stress test is abnormal, she will need further evaluation. Thanks for the consultation.  Yates Decamp, MD 11/15/2014, 9:54 AM [facility] Pager: [phone] Office: [phone] If no answer Cell [phone]

## 2014-11-15 NOTE — Progress Notes (Addendum)
[facility]                                                                              Patient Demographics  Hailey Henry, is a 54 y.o. female, DOB - October 07, 1960, WUJ:[id number]  Admit date - 11/14/2014   Admitting Physician Ripudeep Jenna Luo, MD  Outpatient Primary MD for the patient is No primary care provider on file.  LOS -    Chief Complaint  Patient presents with  . Jaw Pain  . Arm Pain       Brief HPI   Patient is a 54 year old female with no significant past medical problems on no daily prescription medications presented with midsternal chest pain radiating to the jaw and the left arm since yesterday. History was obtained from the patient who reported that she was walking in the [facility] parking lot yesterday after visiting her mother (who is receiving chemotherapy there) when she noticed midsternal chest pain, described as heaviness, radiating to her jaw and left arm. She also noticed heaviness in her left arm with numbness and tingling with the chest pain. Patient however drove home and the chest pain was spontaneously relieved. This morning while she was driving to the [facility] again to visit her mother, she noticed the symptoms returning and she came back to the [facility] ED for evaluation. During the encounter, patient mentioned that she has noticed intermittent chest heaviness since January, sometimes at rest, at times with exertion, spontaneously resolves. She denied any symptoms of belching or burping or any acid reflux. Patient had no prior cardiac workup done. She denied any coughing, shortness of breath, palpitations or diaphoresis, nausea or vomiting.  Patient reported history of massive heart attack in her aunt and uncle (high father's brother and sister) in their 91s, father had stroke. At the time of my evaluation, chest pain had resolved. Troponins negative so far, EKG did not show acute ST-T wave changes suggestive of ischemia     Assessment & Plan   Atypical chest pain: No history of hypertension, hyperlipidemia or diabetes, although has a strong family history of CAD. Nonsmoker. No prior cardiac workup.  - No further chest pains, serial cardiac enzymes remained negative so far - lipid panel showed elevated LDL 124, cholesterol 201 - Placed on aspirin, nitroglycerin sublingual as needed.  - Discussed in detail with cardiology, Dr. Jacinto Halim, recommended stress test Lexiscan today for further workup   Hyperlipidemia - Lipid panel showed elevated LDL 124, cholesterol 201, however patient has no other risk factors -Per cardiology conditions, start on Zocor 10 mg daily, diet and weight control recommended and follow-up with lipid panel in 3 months   Code Status: full code  Family Communication: Discussed in detail with the patient, all imaging results, lab results explained to the patient    Disposition Plan: Home if stress test negative  Time Spent in minutes   25 minutes  Procedures  Nuc Medicine stress test today  Consults   Cardiology, Dr. Jacinto Halim  DVT Prophylaxis  Lovenox  Medications  Scheduled Meds: . aspirin EC  325 mg Oral Daily  . enoxaparin (LOVENOX) injection  40 mg  Subcutaneous Q24H  . multivitamin with minerals  1 tablet Oral Daily   Continuous Infusions:  PRN Meds:.acetaminophen, gi cocktail, morphine injection, nitroGLYCERIN, ondansetron (ZOFRAN) IV   Antibiotics   Anti-infectives    None        Subjective:   Hailey Henry was seen and examined today. Patient denies dizziness, chest pain, shortness of breath, abdominal pain, N/V/D/C, new weakness, numbess, tingling. No acute events overnight.    Objective:   Blood pressure 122/77, pulse 68, temperature 97.6 F (36.4 C), temperature source Oral, resp. rate 18, height 5\' 9"  (1.753 m), weight 110.7 kg (244 lb 0.8 oz), SpO2 98 %.  Wt Readings from Last 3 Encounters:  11/14/14 110.7 kg (244 lb 0.8 oz)     Intake/Output  Summary (Last 24 hours) at 11/15/14 1007 Last data filed at 11/14/14 2300  Gross per 24 hour  Intake    960 ml  Output      0 ml  Net    960 ml    Exam  General: Alert and oriented x 3, NAD  HEENT:  PERRLA, EOMI, Anicteric Sclera, mucous membranes moist.   Neck: Supple, no JVD, no masses  CVS: S1 S2 auscultated, no rubs, murmurs or gallops. Regular rate and rhythm.  Respiratory: Clear to auscultation bilaterally, no wheezing, rales or rhonchi  Abdomen: Soft, nontender, nondistended, + bowel sounds  Ext: no cyanosis clubbing or edema  Neuro: AAOx3, Cr N's II- XII. Strength 5/5 upper and lower extremities bilaterally  Skin: No rashes  Psych: Normal affect and demeanor, alert and oriented x3    Data Review   Micro Results No results found for this or any previous visit (from the past 240 hour(s)).  Radiology Reports Dg Chest 2 View  11/14/2014   CLINICAL DATA:  Left side jaw pain occasional earlier this week. Left arm pain and numbness since last night. Chest pressure this week.  EXAM: CHEST  2 VIEW  COMPARISON:  None.  FINDINGS: Heart is upper limits normal in size. Lungs are clear. No effusions. Rightward scoliosis in the thoracic spine. No acute bony abnormality.  IMPRESSION: No active cardiopulmonary disease.   Electronically Signed   By: Charlett Nose M.D.   On: 11/14/2014 14:20    CBC  Recent Labs Lab 11/14/14 1400 11/15/14 0108  WBC 6.3 7.4  HGB 12.8 11.7*  HCT 38.8 35.3*  PLT 257 252  MCV 90.2 90.1  MCH 29.8 29.8  MCHC 33.0 33.1  RDW 13.7 13.5    Chemistries   Recent Labs Lab 11/14/14 1400 11/15/14 0108  NA 139 137  K 3.8 3.4*  CL 108 106  CO2 26 23  GLUCOSE 85 104*  BUN 10 11  CREATININE 0.63 0.58  CALCIUM 9.1 8.7*   ------------------------------------------------------------------------------------------------------------------ estimated creatinine clearance is 109.1 mL/min (by C-G formula based on Cr of  0.58). ------------------------------------------------------------------------------------------------------------------ No results for input(s): HGBA1C in the last 72 hours. ------------------------------------------------------------------------------------------------------------------  Recent Labs  11/14/14 1900  CHOL 201*  HDL 56  LDLCALC 124*  TRIG 107  CHOLHDL 3.6   ------------------------------------------------------------------------------------------------------------------ No results for input(s): TSH, T4TOTAL, T3FREE, THYROIDAB in the last 72 hours.  Invalid input(s): FREET3 ------------------------------------------------------------------------------------------------------------------ No results for input(s): VITAMINB12, FOLATE, FERRITIN, TIBC, IRON, RETICCTPCT in the last 72 hours.  Coagulation profile No results for input(s): INR, PROTIME in the last 168 hours.  No results for input(s): DDIMER in the last 72 hours.  Cardiac Enzymes  Recent Labs Lab 11/14/14 1900 11/14/14 2208 11/15/14 0108  TROPONINI <0.03 <  0.03 <0.03   ------------------------------------------------------------------------------------------------------------------ Invalid input(s): POCBNP  No results for input(s): GLUCAP in the last 72 hours.   RAI,RIPUDEEP M.D. [facility] 11/15/2014, 10:07 AM  Pager: [phone] Between 7am to 7pm - call Pager - [phone]  After 7pm go to [email or web address] - password [email or web address]  Call night coverage person covering after 7pm

## 2014-11-15 NOTE — Discharge Summary (Signed)
Physician Discharge Summary   Patient ID: Hailey Henry MRN: [id number] DOB/AGE: 52/24/1962 52 y.o.  Admit date: 11/14/2014 Discharge date: 11/15/2014  Primary Care Physician:  Leanor Rubenstein, MD  Discharge Diagnoses:    . Atypical chest pain . hyperlipidemia  Consults:  Cardiology   Recommendations for Outpatient Follow-up:   Lipid panel showed LDL of 124, cholesterol 201, started on Zocor 10 mg daily, lifestyle changes with exercise and low-fat diet  TESTS THAT NEED FOLLOW-UP Lipid panel   DIET: heart healthy diet    Allergies:   Allergies  Allergen Reactions  . Shellfish Allergy Anaphylaxis     Discharge Medications:   Medication List    TAKE these medications        multivitamin with minerals Tabs tablet  Take 1 tablet by mouth daily.     pantoprazole 40 MG tablet  Commonly known as:  PROTONIX  Take 1 tablet (40 mg total) by mouth daily.     simvastatin 10 MG tablet  Commonly known as:  ZOCOR  Take 1 tablet (10 mg total) by mouth at bedtime.         Brief H and P: For complete details please refer to admission H and P, but in brief Patient is a 54 year old female with no significant past medical problems on no daily prescription medications presented with midsternal chest pain radiating to the jaw and the left arm since yesterday. History was obtained from the patient who reported that she was walking in the [facility] parking lot yesterday after visiting her mother (who is receiving chemotherapy there) when she noticed midsternal chest pain, described as heaviness, radiating to her jaw and left arm. She also noticed heaviness in her left arm with numbness and tingling with the chest pain. Patient however drove home and the chest pain was spontaneously relieved. This morning while she was driving to the [facility] again to visit her mother, she noticed the symptoms returning and she came back to the [facility] ED for evaluation. During the  encounter, patient mentioned that she has noticed intermittent chest heaviness since January, sometimes at rest, at times with exertion, spontaneously resolves. She denied any symptoms of belching or burping or any acid reflux. Patient had no prior cardiac workup done. She denied any coughing, shortness of breath, palpitations or diaphoresis, nausea or vomiting.  Patient reported history of massive heart attack in her aunt and uncle (high father's brother and sister) in their 31s, father had stroke. At the time of my evaluation, chest pain had resolved. Troponins negative so far, EKG did not show acute ST-T wave changes suggestive of ischemia    Hospital Course:  Atypical chest pain: No history of hypertension, hyperlipidemia or diabetes, although has a strong family history of CAD. Nonsmoker. No prior cardiac workup.  - No further chest pains, serial cardiac enzymes remained negative so far. - lipid panel showed elevated LDL 124, cholesterol 201 - Placed on aspirin, nitroglycerin sublingual as needed. Patient had no further chest pain episodes during hospitalization. Cardiology was consulted. Dr. Jacinto Halim, recommended stress test Lexiscan.Nuclear medicine stress test showed EF of 67%, and no reversible ischemia, low risk study. Patient was recommended to decrease stress, lifestyle changes for low-fat diet, exercise. She was also recommended to take PPI for 2 weeks and assess improvement in her symptoms.  Hyperlipidemia - Lipid panel showed elevated LDL 124, cholesterol 201, however patient has no other risk factors -Per cardiology conditions, start on Zocor 10 mg daily, diet and weight  control recommended and follow-up with lipid panel in 3 months     Day of Discharge BP 137/75 mmHg  Pulse 69  Temp(Src) 97.6 F (36.4 C) (Oral)  Resp 12  Ht  (1.753 m)  Wt 110.7 kg (244 lb 0.8 oz)  BMI 36.02 kg/m2  SpO2 100%  Physical Exam: General: Alert and awake oriented x3 not in any acute  distress. HEENT: anicteric sclera, pupils reactive to light and accommodation CVS: S1-S2 clear no murmur rubs or gallops Chest: clear to auscultation bilaterally, no wheezing rales or rhonchi Abdomen: soft nontender, nondistended, normal bowel sounds Extremities: no cyanosis, clubbing or edema noted bilaterally Neuro: Cranial nerves II-XII intact, no focal neurological deficits   The results of significant diagnostics from this hospitalization (including imaging, microbiology, ancillary and laboratory) are listed below for reference.    LAB RESULTS: Basic Metabolic Panel:  Recent Labs Lab 11/14/14 1400 11/15/14 0108  NA 139 137  K 3.8 3.4*  CL 108 106  CO2 26 23  GLUCOSE 85 104*  BUN 10 11  CREATININE 0.63 0.58  CALCIUM 9.1 8.7*   Liver Function Tests: No results for input(s): AST, ALT, ALKPHOS, BILITOT, PROT, ALBUMIN in the last 168 hours. No results for input(s): LIPASE, AMYLASE in the last 168 hours. No results for input(s): AMMONIA in the last 168 hours. CBC:  Recent Labs Lab 11/14/14 1400 11/15/14 0108  WBC 6.3 7.4  HGB 12.8 11.7*  HCT 38.8 35.3*  MCV 90.2 90.1  PLT 257 252   Cardiac Enzymes:  Recent Labs Lab 11/14/14 2208 11/15/14 0108  TROPONINI <0.03 <0.03   BNP: Invalid input(s): POCBNP CBG: No results for input(s): GLUCAP in the last 168 hours.  Significant Diagnostic Studies:  No results found.  Nuclear medicine stress test FINDINGS: Perfusion: Normal LEFT ventricular myocardial perfusion following pharmacologic stress. Resting exam unchanged.  Wall Motion: Normal left ventricular wall motion. No left ventricular dilation.  Left Ventricular Ejection Fraction: 67 %  End diastolic volume 96 ml  End systolic volume 31 ml  IMPRESSION: 1. No reversible ischemia or infarction.  2. Normal left ventricular wall motion.  3. Left ventricular ejection fraction 67%  4. Low-risk stress test findings*.   Disposition and  Follow-up: Discharge Instructions    Diet - low sodium heart healthy    Complete by:  As directed      Increase activity slowly    Complete by:  As directed             DISPOSITION: Home  DISCHARGE FOLLOW-UP Follow-up Information    Follow up with Leanor Rubenstein, MD. Schedule an appointment as soon as possible for a visit in 2 weeks.   Specialty:  Family Medicine   Why:  for hospital follow-up   Contact information:   [address] [place] [address] [phone]        Time spent on Discharge: 35 mins   Signed:   RAI,RIPUDEEP M.D. [facility] 11/15/2014, 1:38 PM Pager: [phone]

## 2015-06-30 ENCOUNTER — Other Ambulatory Visit: Payer: Self-pay | Admitting: Obstetrics and Gynecology

## 2015-07-01 LAB — CYTOLOGY - PAP

## 2016-08-07 ENCOUNTER — Other Ambulatory Visit: Payer: Self-pay | Admitting: Obstetrics and Gynecology

## 2016-08-09 LAB — CYTOLOGY - PAP

## 2017-06-04 DIAGNOSIS — M2550 Pain in unspecified joint: Secondary | ICD-10-CM | POA: Diagnosis not present

## 2017-06-04 DIAGNOSIS — Z79899 Other long term (current) drug therapy: Secondary | ICD-10-CM | POA: Diagnosis not present

## 2017-06-04 DIAGNOSIS — M0579 Rheumatoid arthritis with rheumatoid factor of multiple sites without organ or systems involvement: Secondary | ICD-10-CM | POA: Diagnosis not present

## 2017-06-04 DIAGNOSIS — M5432 Sciatica, left side: Secondary | ICD-10-CM | POA: Diagnosis not present

## 2017-09-04 DIAGNOSIS — M2550 Pain in unspecified joint: Secondary | ICD-10-CM | POA: Diagnosis not present

## 2017-09-04 DIAGNOSIS — Z79899 Other long term (current) drug therapy: Secondary | ICD-10-CM | POA: Diagnosis not present

## 2017-09-04 DIAGNOSIS — M0579 Rheumatoid arthritis with rheumatoid factor of multiple sites without organ or systems involvement: Secondary | ICD-10-CM | POA: Diagnosis not present

## 2017-09-06 DIAGNOSIS — N841 Polyp of cervix uteri: Secondary | ICD-10-CM | POA: Diagnosis not present

## 2017-09-06 DIAGNOSIS — Z6833 Body mass index (BMI) 33.0-33.9, adult: Secondary | ICD-10-CM | POA: Diagnosis not present

## 2017-09-06 DIAGNOSIS — Z01419 Encounter for gynecological examination (general) (routine) without abnormal findings: Secondary | ICD-10-CM | POA: Diagnosis not present

## 2017-09-06 DIAGNOSIS — Z124 Encounter for screening for malignant neoplasm of cervix: Secondary | ICD-10-CM | POA: Diagnosis not present

## 2017-09-06 DIAGNOSIS — Z1231 Encounter for screening mammogram for malignant neoplasm of breast: Secondary | ICD-10-CM | POA: Diagnosis not present

## 2017-12-05 DIAGNOSIS — Z79899 Other long term (current) drug therapy: Secondary | ICD-10-CM | POA: Diagnosis not present

## 2017-12-05 DIAGNOSIS — M2550 Pain in unspecified joint: Secondary | ICD-10-CM | POA: Diagnosis not present

## 2017-12-05 DIAGNOSIS — M0579 Rheumatoid arthritis with rheumatoid factor of multiple sites without organ or systems involvement: Secondary | ICD-10-CM | POA: Diagnosis not present

## 2018-04-08 DIAGNOSIS — M2550 Pain in unspecified joint: Secondary | ICD-10-CM | POA: Diagnosis not present

## 2018-04-08 DIAGNOSIS — M0579 Rheumatoid arthritis with rheumatoid factor of multiple sites without organ or systems involvement: Secondary | ICD-10-CM | POA: Diagnosis not present

## 2018-04-08 DIAGNOSIS — Z79899 Other long term (current) drug therapy: Secondary | ICD-10-CM | POA: Diagnosis not present

## 2018-07-08 DIAGNOSIS — M0579 Rheumatoid arthritis with rheumatoid factor of multiple sites without organ or systems involvement: Secondary | ICD-10-CM | POA: Diagnosis not present

## 2018-07-08 DIAGNOSIS — Z79899 Other long term (current) drug therapy: Secondary | ICD-10-CM | POA: Diagnosis not present

## 2018-07-08 DIAGNOSIS — M2550 Pain in unspecified joint: Secondary | ICD-10-CM | POA: Diagnosis not present

## 2018-07-10 DIAGNOSIS — M0579 Rheumatoid arthritis with rheumatoid factor of multiple sites without organ or systems involvement: Secondary | ICD-10-CM | POA: Diagnosis not present

## 2018-10-09 DIAGNOSIS — Z79899 Other long term (current) drug therapy: Secondary | ICD-10-CM | POA: Diagnosis not present

## 2018-10-09 DIAGNOSIS — M0579 Rheumatoid arthritis with rheumatoid factor of multiple sites without organ or systems involvement: Secondary | ICD-10-CM | POA: Diagnosis not present

## 2018-10-09 DIAGNOSIS — M2550 Pain in unspecified joint: Secondary | ICD-10-CM | POA: Diagnosis not present

## 2019-01-24 DIAGNOSIS — M0579 Rheumatoid arthritis with rheumatoid factor of multiple sites without organ or systems involvement: Secondary | ICD-10-CM | POA: Diagnosis not present

## 2019-01-24 DIAGNOSIS — M2550 Pain in unspecified joint: Secondary | ICD-10-CM | POA: Diagnosis not present

## 2019-01-24 DIAGNOSIS — Z79899 Other long term (current) drug therapy: Secondary | ICD-10-CM | POA: Diagnosis not present

## 2019-02-18 DIAGNOSIS — E7800 Pure hypercholesterolemia, unspecified: Secondary | ICD-10-CM | POA: Diagnosis not present

## 2019-02-18 DIAGNOSIS — M05742 Rheumatoid arthritis with rheumatoid factor of left hand without organ or systems involvement: Secondary | ICD-10-CM | POA: Diagnosis not present

## 2019-02-18 DIAGNOSIS — E6609 Other obesity due to excess calories: Secondary | ICD-10-CM | POA: Diagnosis not present

## 2019-02-18 DIAGNOSIS — I10 Essential (primary) hypertension: Secondary | ICD-10-CM | POA: Diagnosis not present

## 2019-03-12 DIAGNOSIS — E7800 Pure hypercholesterolemia, unspecified: Secondary | ICD-10-CM | POA: Diagnosis not present

## 2019-03-12 DIAGNOSIS — E6609 Other obesity due to excess calories: Secondary | ICD-10-CM | POA: Diagnosis not present

## 2019-03-12 DIAGNOSIS — I10 Essential (primary) hypertension: Secondary | ICD-10-CM | POA: Diagnosis not present

## 2019-03-26 DIAGNOSIS — R079 Chest pain, unspecified: Secondary | ICD-10-CM | POA: Diagnosis not present

## 2019-03-26 DIAGNOSIS — K219 Gastro-esophageal reflux disease without esophagitis: Secondary | ICD-10-CM | POA: Diagnosis not present

## 2019-03-26 DIAGNOSIS — E7800 Pure hypercholesterolemia, unspecified: Secondary | ICD-10-CM | POA: Diagnosis not present

## 2019-03-26 DIAGNOSIS — I10 Essential (primary) hypertension: Secondary | ICD-10-CM | POA: Diagnosis not present

## 2019-04-09 DIAGNOSIS — E6609 Other obesity due to excess calories: Secondary | ICD-10-CM | POA: Diagnosis not present

## 2019-04-09 DIAGNOSIS — I10 Essential (primary) hypertension: Secondary | ICD-10-CM | POA: Diagnosis not present

## 2019-04-09 DIAGNOSIS — E7800 Pure hypercholesterolemia, unspecified: Secondary | ICD-10-CM | POA: Diagnosis not present

## 2019-05-02 DIAGNOSIS — M2550 Pain in unspecified joint: Secondary | ICD-10-CM | POA: Diagnosis not present

## 2019-05-02 DIAGNOSIS — Z79899 Other long term (current) drug therapy: Secondary | ICD-10-CM | POA: Diagnosis not present

## 2019-05-02 DIAGNOSIS — M0579 Rheumatoid arthritis with rheumatoid factor of multiple sites without organ or systems involvement: Secondary | ICD-10-CM | POA: Diagnosis not present

## 2019-06-13 ENCOUNTER — Ambulatory Visit: Payer: BC Managed Care – PPO | Attending: Internal Medicine

## 2019-06-13 DIAGNOSIS — Z23 Encounter for immunization: Secondary | ICD-10-CM

## 2019-06-13 NOTE — Progress Notes (Signed)
   Covid-19 Vaccination Clinic  Name:  KARIYAH BAUGH    MRN: [id number] DOB: 05-17-1960  06/13/2019  Ms. Orndoff was observed post Covid-19 immunization for 15 minutes without incident. She was provided with Vaccine Information Sheet and instruction to access the V-Safe system.   Ms. Bowlds was instructed to call 911 with any severe reactions post vaccine: Marland Kitchen Difficulty breathing  . Swelling of face and throat  . A fast heartbeat  . A bad rash all over body  . Dizziness and weakness   Immunizations Administered    Name Date Dose VIS Date Route   Pfizer COVID-19 Vaccine 06/13/2019 10:03 AM 0.3 mL 02/28/2019 Intramuscular   Manufacturer: [employer], [employer]   Lot: [id number]   NDC: [address]-4

## 2019-07-07 ENCOUNTER — Ambulatory Visit: Payer: BC Managed Care – PPO | Attending: Internal Medicine

## 2019-07-07 DIAGNOSIS — Z23 Encounter for immunization: Secondary | ICD-10-CM

## 2019-07-07 NOTE — Progress Notes (Signed)
   Covid-19 Vaccination Clinic  Name:  KHANH CORDNER    MRN: [id number] DOB: 1960-09-05  07/07/2019  Ms. Morning was observed post Covid-19 immunization for 15 minutes without incident. She was provided with Vaccine Information Sheet and instruction to access the V-Safe system.   Ms. Miggins was instructed to call 911 with any severe reactions post vaccine: Marland Kitchen Difficulty breathing  . Swelling of face and throat  . A fast heartbeat  . A bad rash all over body  . Dizziness and weakness   Immunizations Administered    Name Date Dose VIS Date Route   Pfizer COVID-19 Vaccine 07/07/2019  2:57 PM 0.3 mL 05/14/2018 Intramuscular   Manufacturer: [employer], [employer]   Lot: [id number]   NDC: [address]-1

## 2019-07-31 DIAGNOSIS — Z79899 Other long term (current) drug therapy: Secondary | ICD-10-CM | POA: Diagnosis not present

## 2019-07-31 DIAGNOSIS — M0579 Rheumatoid arthritis with rheumatoid factor of multiple sites without organ or systems involvement: Secondary | ICD-10-CM | POA: Diagnosis not present

## 2019-07-31 DIAGNOSIS — M7702 Medial epicondylitis, left elbow: Secondary | ICD-10-CM | POA: Diagnosis not present

## 2019-07-31 DIAGNOSIS — M2550 Pain in unspecified joint: Secondary | ICD-10-CM | POA: Diagnosis not present

## 2019-09-30 DIAGNOSIS — M0579 Rheumatoid arthritis with rheumatoid factor of multiple sites without organ or systems involvement: Secondary | ICD-10-CM | POA: Diagnosis not present

## 2020-01-30 DIAGNOSIS — Z23 Encounter for immunization: Secondary | ICD-10-CM | POA: Diagnosis not present

## 2020-01-30 DIAGNOSIS — Z79899 Other long term (current) drug therapy: Secondary | ICD-10-CM | POA: Diagnosis not present

## 2020-01-30 DIAGNOSIS — M7702 Medial epicondylitis, left elbow: Secondary | ICD-10-CM | POA: Diagnosis not present

## 2020-01-30 DIAGNOSIS — M2550 Pain in unspecified joint: Secondary | ICD-10-CM | POA: Diagnosis not present

## 2020-01-30 DIAGNOSIS — M0579 Rheumatoid arthritis with rheumatoid factor of multiple sites without organ or systems involvement: Secondary | ICD-10-CM | POA: Diagnosis not present

## 2020-03-05 ENCOUNTER — Ambulatory Visit: Payer: BC Managed Care – PPO

## 2020-03-05 DIAGNOSIS — Z20822 Contact with and (suspected) exposure to covid-19: Secondary | ICD-10-CM | POA: Diagnosis not present

## 2020-03-15 ENCOUNTER — Ambulatory Visit: Payer: BC Managed Care – PPO | Attending: Internal Medicine

## 2020-03-15 DIAGNOSIS — Z23 Encounter for immunization: Secondary | ICD-10-CM

## 2020-03-15 NOTE — Progress Notes (Signed)
   Covid-19 Vaccination Clinic  Name:  KENSY BLIZARD    MRN: [id number] DOB: 12/07/1960  03/15/2020  Ms. Pinho was observed post Covid-19 immunization for 15 minutes without incident. She was provided with Vaccine Information Sheet and instruction to access the V-Safe system.   Ms. Space was instructed to call 911 with any severe reactions post vaccine: Marland Kitchen Difficulty breathing  . Swelling of face and throat  . A fast heartbeat  . A bad rash all over body  . Dizziness and weakness   Immunizations Administered    Name Date Dose VIS Date Route   Pfizer COVID-19 Vaccine 03/15/2020  2:23 PM 0.3 mL 01/07/2020 Intramuscular   Manufacturer: [employer], [employer]   Lot: [id number]   NDC: [address]-4

## 2020-04-30 DIAGNOSIS — M0579 Rheumatoid arthritis with rheumatoid factor of multiple sites without organ or systems involvement: Secondary | ICD-10-CM | POA: Diagnosis not present

## 2020-07-23 DIAGNOSIS — M7702 Medial epicondylitis, left elbow: Secondary | ICD-10-CM | POA: Diagnosis not present

## 2020-07-23 DIAGNOSIS — Z79899 Other long term (current) drug therapy: Secondary | ICD-10-CM | POA: Diagnosis not present

## 2020-07-23 DIAGNOSIS — M0579 Rheumatoid arthritis with rheumatoid factor of multiple sites without organ or systems involvement: Secondary | ICD-10-CM | POA: Diagnosis not present

## 2020-07-23 DIAGNOSIS — M2550 Pain in unspecified joint: Secondary | ICD-10-CM | POA: Diagnosis not present

## 2020-10-19 DIAGNOSIS — Z20822 Contact with and (suspected) exposure to covid-19: Secondary | ICD-10-CM | POA: Diagnosis not present

## 2020-10-25 DIAGNOSIS — Z20822 Contact with and (suspected) exposure to covid-19: Secondary | ICD-10-CM | POA: Diagnosis not present

## 2020-10-29 DIAGNOSIS — M0579 Rheumatoid arthritis with rheumatoid factor of multiple sites without organ or systems involvement: Secondary | ICD-10-CM | POA: Diagnosis not present

## 2021-01-24 DIAGNOSIS — Z79899 Other long term (current) drug therapy: Secondary | ICD-10-CM | POA: Diagnosis not present

## 2021-01-24 DIAGNOSIS — M2550 Pain in unspecified joint: Secondary | ICD-10-CM | POA: Diagnosis not present

## 2021-01-24 DIAGNOSIS — M0579 Rheumatoid arthritis with rheumatoid factor of multiple sites without organ or systems involvement: Secondary | ICD-10-CM | POA: Diagnosis not present

## 2021-01-24 DIAGNOSIS — R5383 Other fatigue: Secondary | ICD-10-CM | POA: Diagnosis not present

## 2021-02-01 DIAGNOSIS — Z1231 Encounter for screening mammogram for malignant neoplasm of breast: Secondary | ICD-10-CM | POA: Diagnosis not present

## 2021-02-01 DIAGNOSIS — Z01419 Encounter for gynecological examination (general) (routine) without abnormal findings: Secondary | ICD-10-CM | POA: Diagnosis not present

## 2021-02-01 DIAGNOSIS — Z124 Encounter for screening for malignant neoplasm of cervix: Secondary | ICD-10-CM | POA: Diagnosis not present

## 2021-02-02 ENCOUNTER — Other Ambulatory Visit: Payer: Self-pay | Admitting: Obstetrics and Gynecology

## 2021-02-02 DIAGNOSIS — R928 Other abnormal and inconclusive findings on diagnostic imaging of breast: Secondary | ICD-10-CM

## 2021-03-01 ENCOUNTER — Other Ambulatory Visit: Payer: Self-pay

## 2021-03-01 ENCOUNTER — Other Ambulatory Visit: Payer: Self-pay | Admitting: Obstetrics and Gynecology

## 2021-03-01 ENCOUNTER — Ambulatory Visit
Admission: RE | Admit: 2021-03-01 | Discharge: 2021-03-01 | Disposition: A | Discharge status: Home / Self Care | Payer: BC Managed Care – PPO | Source: Ambulatory Visit | Attending: Obstetrics and Gynecology | Admitting: Obstetrics and Gynecology

## 2021-03-01 DIAGNOSIS — R928 Other abnormal and inconclusive findings on diagnostic imaging of breast: Secondary | ICD-10-CM

## 2021-03-01 DIAGNOSIS — R922 Inconclusive mammogram: Secondary | ICD-10-CM | POA: Diagnosis not present

## 2021-03-07 ENCOUNTER — Other Ambulatory Visit: Payer: BC Managed Care – PPO

## 2021-03-15 ENCOUNTER — Ambulatory Visit
Admission: RE | Admit: 2021-03-15 | Discharge: 2021-03-15 | Disposition: A | Discharge status: Home / Self Care | Payer: BC Managed Care – PPO | Source: Ambulatory Visit | Attending: Obstetrics and Gynecology | Admitting: Obstetrics and Gynecology

## 2021-03-15 DIAGNOSIS — R928 Other abnormal and inconclusive findings on diagnostic imaging of breast: Secondary | ICD-10-CM

## 2021-03-15 DIAGNOSIS — N6012 Diffuse cystic mastopathy of left breast: Secondary | ICD-10-CM | POA: Diagnosis not present

## 2021-03-15 DIAGNOSIS — N6325 Unspecified lump in the left breast, overlapping quadrants: Secondary | ICD-10-CM | POA: Diagnosis not present

## 2021-04-29 DIAGNOSIS — M0579 Rheumatoid arthritis with rheumatoid factor of multiple sites without organ or systems involvement: Secondary | ICD-10-CM | POA: Diagnosis not present

## 2021-07-04 DIAGNOSIS — R062 Wheezing: Secondary | ICD-10-CM | POA: Diagnosis not present

## 2021-07-04 DIAGNOSIS — I10 Essential (primary) hypertension: Secondary | ICD-10-CM | POA: Diagnosis not present

## 2021-07-04 DIAGNOSIS — H9311 Tinnitus, right ear: Secondary | ICD-10-CM | POA: Diagnosis not present

## 2021-07-29 DIAGNOSIS — Z79899 Other long term (current) drug therapy: Secondary | ICD-10-CM | POA: Diagnosis not present

## 2021-07-29 DIAGNOSIS — M2550 Pain in unspecified joint: Secondary | ICD-10-CM | POA: Diagnosis not present

## 2021-07-29 DIAGNOSIS — M0579 Rheumatoid arthritis with rheumatoid factor of multiple sites without organ or systems involvement: Secondary | ICD-10-CM | POA: Diagnosis not present

## 2021-07-29 DIAGNOSIS — R5383 Other fatigue: Secondary | ICD-10-CM | POA: Diagnosis not present

## 2021-10-03 DIAGNOSIS — E6609 Other obesity due to excess calories: Secondary | ICD-10-CM | POA: Diagnosis not present

## 2021-10-03 DIAGNOSIS — Z1211 Encounter for screening for malignant neoplasm of colon: Secondary | ICD-10-CM | POA: Diagnosis not present

## 2021-10-03 DIAGNOSIS — E7800 Pure hypercholesterolemia, unspecified: Secondary | ICD-10-CM | POA: Diagnosis not present

## 2021-10-18 DIAGNOSIS — E7800 Pure hypercholesterolemia, unspecified: Secondary | ICD-10-CM | POA: Diagnosis not present

## 2021-10-18 DIAGNOSIS — I10 Essential (primary) hypertension: Secondary | ICD-10-CM | POA: Diagnosis not present

## 2021-10-28 DIAGNOSIS — M0579 Rheumatoid arthritis with rheumatoid factor of multiple sites without organ or systems involvement: Secondary | ICD-10-CM | POA: Diagnosis not present

## 2021-10-28 DIAGNOSIS — Z79899 Other long term (current) drug therapy: Secondary | ICD-10-CM | POA: Diagnosis not present

## 2022-01-03 DIAGNOSIS — J209 Acute bronchitis, unspecified: Secondary | ICD-10-CM | POA: Diagnosis not present

## 2022-01-03 DIAGNOSIS — I10 Essential (primary) hypertension: Secondary | ICD-10-CM | POA: Diagnosis not present

## 2022-01-16 IMAGING — MG MM DIGITAL DIAGNOSTIC UNILAT*L* W/ TOMO W/ CAD
8 series · 8 of 24 positions shown · non-contrast
Comparison: Previous exam(s).

CLINICAL DATA: Patient recalled from screening for left breast
mass.

EXAM:
DIGITAL DIAGNOSTIC UNILATERAL LEFT MAMMOGRAM WITH TOMOSYNTHESIS AND
CAD; ULTRASOUND LEFT BREAST LIMITED
TECHNIQUE: Left digital diagnostic mammography and breast tomosynthesis was
performed. The images were evaluated with computer-aided detection.;
Targeted ultrasound examination of the left breast was performed.

[L CC synth-2D (1 of 3)]
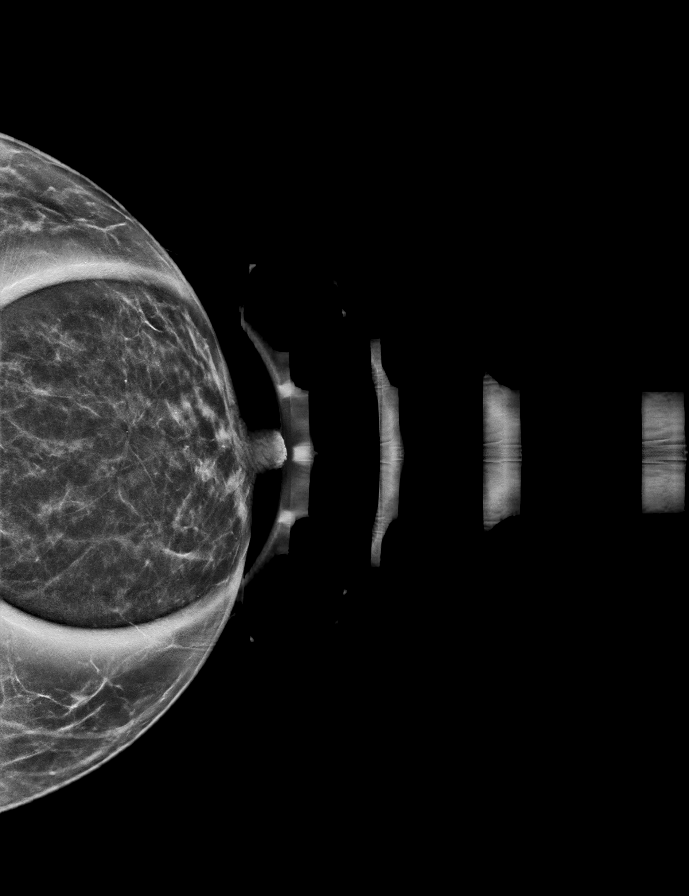

[L CC synth-2D (2 of 3)]
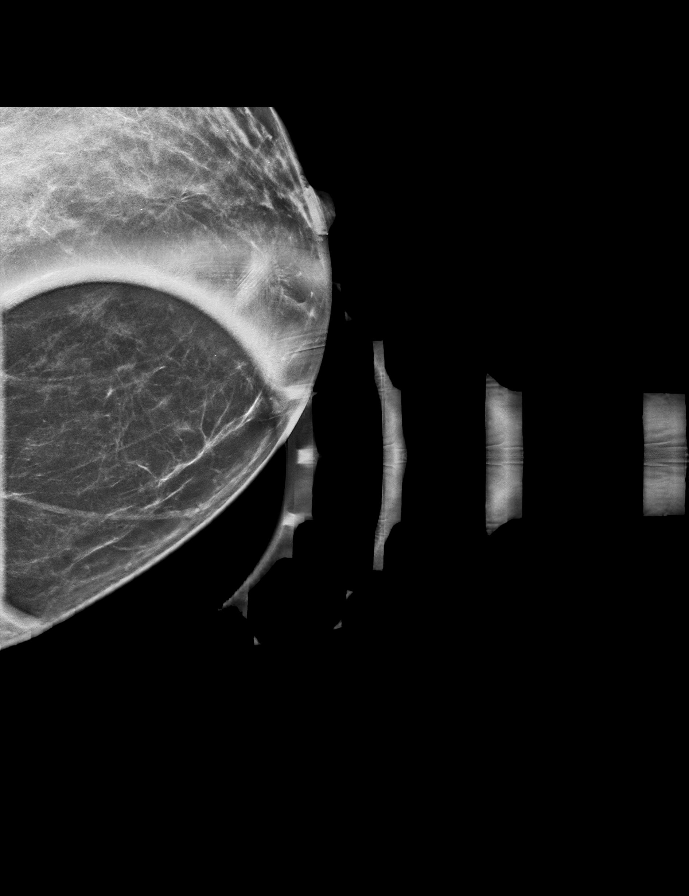

[L MLO synth-2D]
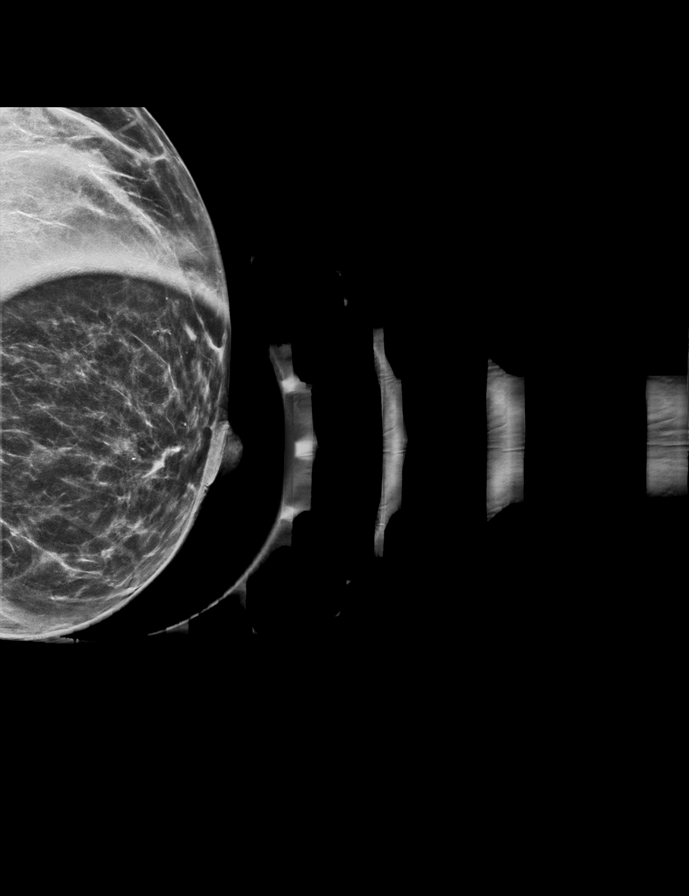

[L CC synth-2D (3 of 3)]
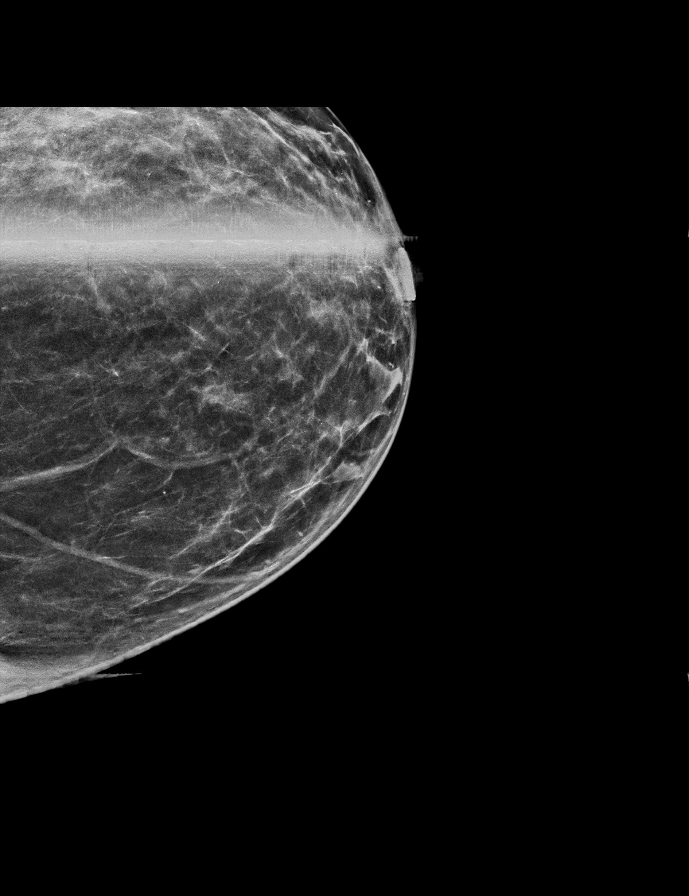

[L CC tomo (1 of 3) · tomo slice 29/57.0]
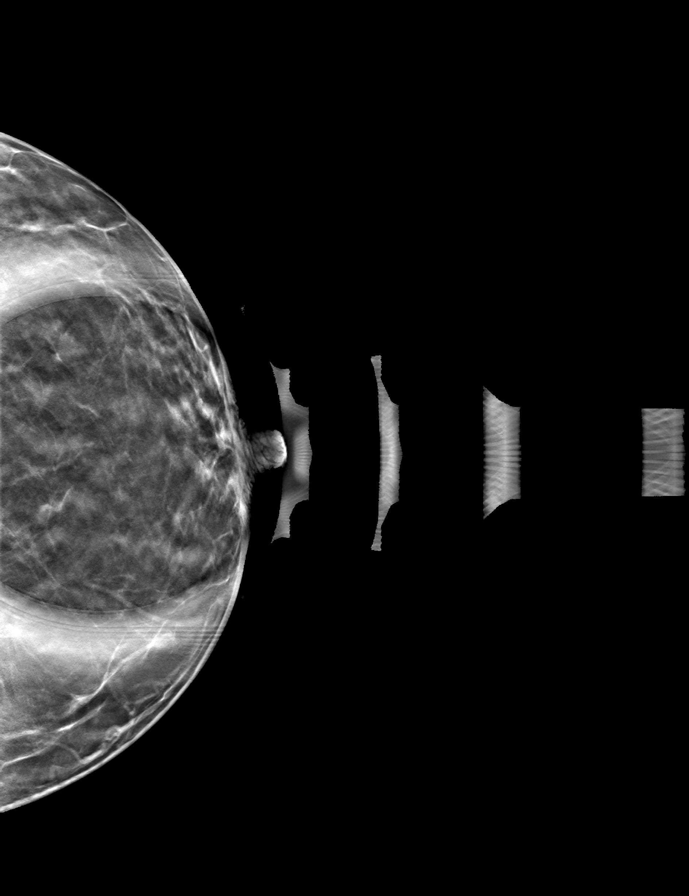

[L MLO tomo · tomo slice 31/60.0]
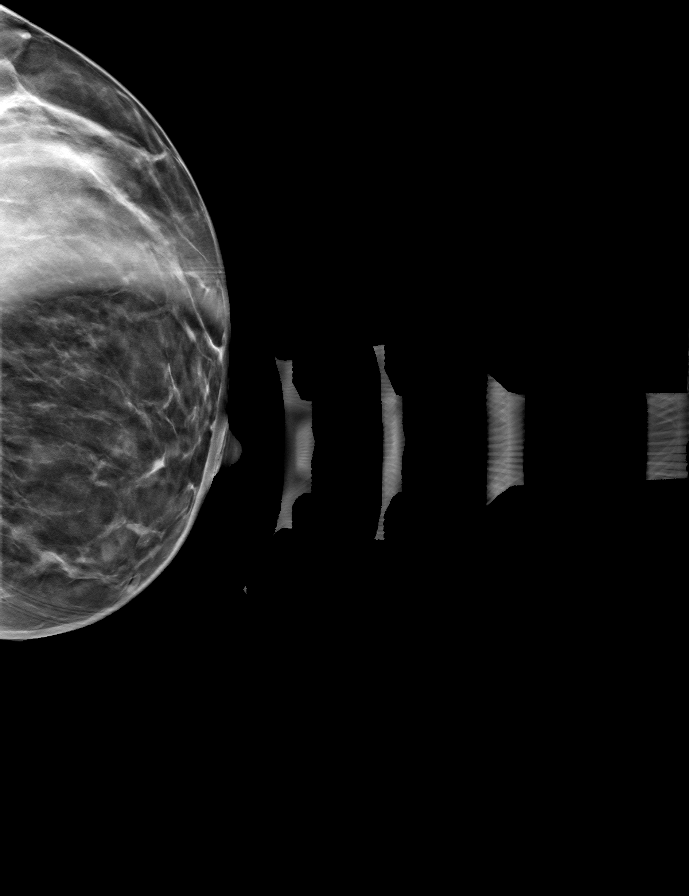

[L CC tomo (2 of 3) · tomo slice 29/56.0]
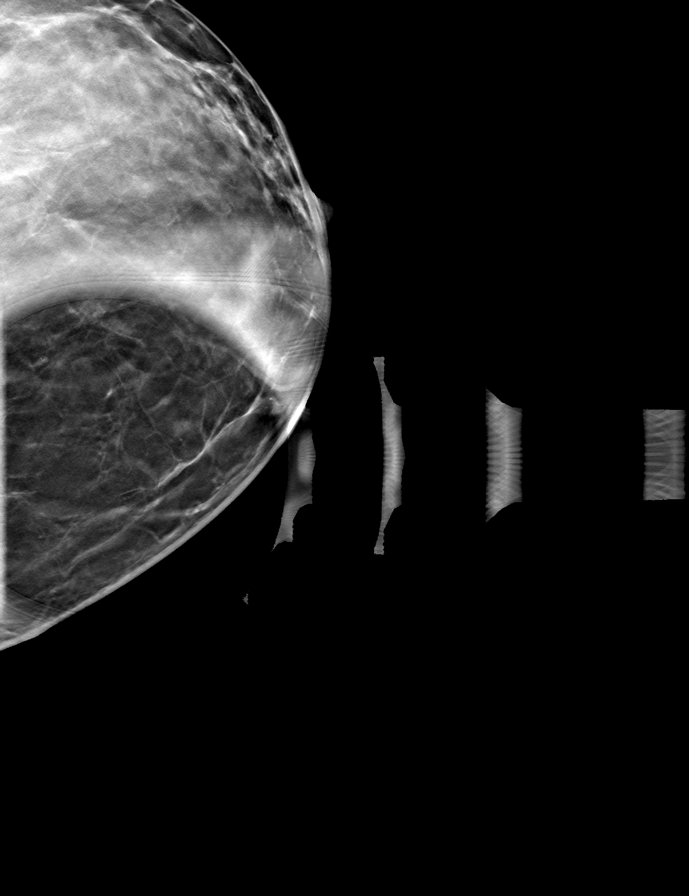

[L CC tomo (3 of 3) · tomo slice 30/59.0]
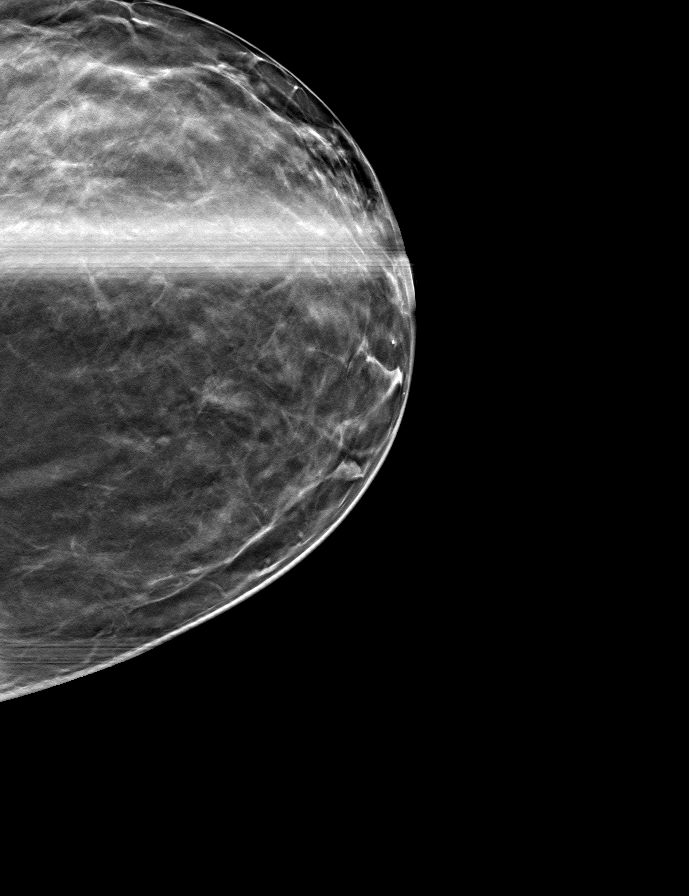

[8 of 24 positions shown; findings below may reference images not displayed]

ACR Breast Density Category c: The breast tissue is heterogeneously
dense, which may obscure small masses.
FINDINGS: There is a persistent low-density lobular mass within the medial
left breast anterior depth, further evaluated with spot compression
views.

Targeted ultrasound is performed, showing a 4 x 4 x 4 mm round solid
and cystic mass left breast 9 o'clock position 3 cm from the nipple
with internal color vascularity. No left axillary adenopathy.

Additionally, incidentally identified is a 5 x 4 x 5 mm cyst left
breast 3 o'clock position retroareolar location.
IMPRESSION: Indeterminate left breast mass 9 o'clock position.

RECOMMENDATION:
Ultrasound-guided core needle biopsy left breast mass 9 o'clock
position.

I have discussed the findings and recommendations with the patient.
If applicable, a reminder letter will be sent to the patient
regarding the next appointment.

BI-RADS CATEGORY  4: Suspicious.

## 2022-01-25 ENCOUNTER — Ambulatory Visit
Admission: EM | Admit: 2022-01-25 | Discharge: 2022-01-25 | Disposition: A | Discharge status: Home / Self Care | Payer: BC Managed Care – PPO

## 2022-01-25 ENCOUNTER — Ambulatory Visit (INDEPENDENT_AMBULATORY_CARE_PROVIDER_SITE_OTHER): Payer: BC Managed Care – PPO

## 2022-01-25 DIAGNOSIS — R053 Chronic cough: Secondary | ICD-10-CM | POA: Diagnosis not present

## 2022-01-25 DIAGNOSIS — R0781 Pleurodynia: Secondary | ICD-10-CM

## 2022-01-25 DIAGNOSIS — R079 Chest pain, unspecified: Secondary | ICD-10-CM | POA: Diagnosis not present

## 2022-01-25 NOTE — ED Provider Notes (Signed)
[facility]    CSN: [id number] Arrival date & time: 01/25/22  1312    HISTORY   Chief Complaint  Patient presents with   Nasal Congestion   Cough   HPI FRANCY MCILVAINE is a pleasant, 61 y.o. female who presents to urgent care today. States that 2 to 3 weeks ago, she was treated with antibiotics for presumed bacterial bronchitis.  States her initial symptoms of nasal congestion and cough began about a month ago.  Patient states she took all of the antibiotics as prescribed but continues to have persistent cough.  Patient states the cough now causes her to have sharp, shooting pain that begins on the left side of her rib cage and shoots across to the middle of her rib cage.  Patient denies fever.  Patient endorses shortness of breath, states she was provided with an inhaler to use when she was prescribed the antibiotics, states she has been using this but this does not seem to improve the pain.  Patient states the cough is not productive but it feels like it should be.  Patient states is painful to cough.  Patient has a history of autoimmune disease, currently on leflunomide and Humira.  The history is provided by the patient.   History reviewed. No pertinent past medical history. Patient Active Problem List   Diagnosis Date Noted   Atypical chest pain 11/14/2014   Chest pain 11/14/2014   Past Surgical History:  Procedure Laterality Date   ABLATION     HEMORRHOID SURGERY     OB History   No obstetric history on file.    Home Medications    Prior to Admission medications   Medication Sig Start Date End Date Taking? Authorizing Provider  amLODipine (NORVASC) 10 MG tablet Take 10 mg by mouth daily. 01/03/22  Yes [provider]  HUMIRA PEN 40 MG/0.4ML PNKT     [provider]  leflunomide (ARAVA) 20 MG tablet     [provider]  Multiple Vitamin (MULTIVITAMIN WITH MINERALS) TABS tablet Take 1 tablet by mouth daily.    [provider]    Family History History reviewed. No pertinent family history. Social History Social History   Tobacco Use   Smoking status: Never  Vaping Use   Vaping Use: Never used  Substance Use Topics   Alcohol use: Not Currently   Drug use: Never   Allergies   Shellfish allergy  Review of Systems Review of Systems Pertinent findings revealed after performing a 14 point review of systems has been noted in the history of present illness.  Physical Exam Triage Vital Signs ED Triage Vitals  Enc Vitals Group     BP 01/14/21 0827 (!) 147/82     Pulse Rate 01/14/21 0827 72     Resp 01/14/21 0827 18     Temp 01/14/21 0827 98.3 F (36.8 C)     Temp Source 01/14/21 0827 Oral     SpO2 01/14/21 0827 98 %     Weight --      Height --      Head Circumference --      Peak Flow --      Pain Score 01/14/21 0826 5     Pain Loc --      Pain Edu? --      Excl. in GC? --   No data found.  Updated Vital Signs BP 128/88 (BP Location: Right Arm)   Pulse 80   Temp 98.4  F (36.9 C) (Oral)   Resp 16   SpO2 97%   Physical Exam Vitals and nursing note reviewed.  Constitutional:      General: She is not in acute distress.    Appearance: Normal appearance. She is not ill-appearing.  HENT:     Head: Normocephalic and atraumatic.     Salivary Glands: Right salivary gland is not diffusely enlarged or tender. Left salivary gland is not diffusely enlarged or tender.     Right Ear: Tympanic membrane, ear canal and external ear normal. No drainage. No middle ear effusion. There is no impacted cerumen. Tympanic membrane is not erythematous or bulging.     Left Ear: Tympanic membrane, ear canal and external ear normal. No drainage.  No middle ear effusion. There is no impacted cerumen. Tympanic membrane is not erythematous or bulging.     Nose: Mucosal edema, congestion and rhinorrhea present. No nasal deformity or septal deviation. Rhinorrhea is clear.     Right Turbinates: Not enlarged, swollen  or pale.     Left Turbinates: Not enlarged, swollen or pale.     Right Sinus: No maxillary sinus tenderness or frontal sinus tenderness.     Left Sinus: No maxillary sinus tenderness or frontal sinus tenderness.     Mouth/Throat:     Lips: Pink. No lesions.     Mouth: Mucous membranes are moist. No oral lesions.     Pharynx: Oropharynx is clear. Uvula midline. No posterior oropharyngeal erythema or uvula swelling.     Tonsils: No tonsillar exudate. 0 on the right. 0 on the left.  Eyes:     General: Lids are normal.        Right eye: No discharge.        Left eye: No discharge.     Extraocular Movements: Extraocular movements intact.     Conjunctiva/sclera: Conjunctivae normal.     Right eye: Right conjunctiva is not injected.     Left eye: Left conjunctiva is not injected.  Neck:     Trachea: Trachea and phonation normal.  Cardiovascular:     Rate and Rhythm: Normal rate and regular rhythm.     Pulses: Normal pulses.     Heart sounds: Normal heart sounds. No murmur heard.    No friction rub. No gallop.  Pulmonary:     Effort: Pulmonary effort is normal. No tachypnea, bradypnea, accessory muscle usage, prolonged expiration, respiratory distress or retractions.     Breath sounds: Decreased air movement present. No stridor or transmitted upper airway sounds. Examination of the right-middle field reveals rales. Examination of the left-middle field reveals rales. Examination of the right-lower field reveals decreased breath sounds and rales. Examination of the left-lower field reveals decreased breath sounds and rales. Decreased breath sounds and rales present. No wheezing or rhonchi.  Chest:     Chest wall: No tenderness.  Musculoskeletal:        General: Normal range of motion.     Cervical back: Normal range of motion and neck supple. Normal range of motion.  Lymphadenopathy:     Cervical: No cervical adenopathy.  Skin:    General: Skin is warm and dry.     Findings: No erythema or  rash.  Neurological:     General: No focal deficit present.     Mental Status: She is alert and oriented to person, place, and time.  Psychiatric:        Mood and Affect: Mood normal.  Behavior: Behavior normal.     Visual Acuity Right Eye Distance:   Left Eye Distance:   Bilateral Distance:    Right Eye Near:   Left Eye Near:    Bilateral Near:     [facility] Couse / Diagnostics / Procedures:     Radiology DG Chest 2 View  Result Date: 01/25/2022 CLINICAL DATA:  Left-sided pleuritic chest pain EXAM: CHEST - 2 VIEW COMPARISON:  Chest x-ray 11/14/2014. FINDINGS: Mild linear scarring/atelectasis at the lung bases. No confluent consolidation. No visible pleural effusions or pneumothorax. Cardiomediastinal silhouette is within normal limits. S-shaped thoracolumbar curvature. Polyarticular degenerative change. IMPRESSION: Mild scarring/atelectasis at the lung bases. Otherwise, no active cardiopulmonary disease. Electronically Signed   By: Feliberto Harts M.D.   On: 01/25/2022 14:44    Procedures Procedures (including critical care time) EKG  Pending results:  Labs Reviewed - No data to display  Medications Ordered in UC: Medications - No data to display  UC Diagnoses / Final Clinical Impressions(s)   I have reviewed the triage vital signs and the nursing notes.  Pertinent labs & imaging results that were available during my care of the patient were reviewed by me and considered in my medical decision making (see chart for details).    Final diagnoses:  Anterior pleuritic pain  Persistent cough for 3 weeks or longer   Patient advised of chest x-ray findings.  Patient advised to follow-up with PCP if symptoms do not improve, CT scan of chest may be indicated.  ED Prescriptions   None    PDMP not reviewed this encounter.  Disposition Upon Discharge:  Condition: stable for discharge home Home: take medications as prescribed; routine discharge instructions as discussed;  follow up as advised.  Patient presented with an acute illness with associated systemic symptoms and significant discomfort requiring urgent management. In my opinion, this is a condition that a prudent lay person (someone who possesses an average knowledge of health and medicine) may potentially expect to result in complications if not addressed urgently such as respiratory distress, impairment of bodily function or dysfunction of bodily organs.   Routine symptom specific, illness specific and/or disease specific instructions were discussed with the patient and/or caregiver at length.   As such, the patient has been evaluated and assessed, work-up was performed and treatment was provided in alignment with urgent care protocols and evidence based medicine.  Patient/parent/caregiver has been advised that the patient may require follow up for further testing and treatment if the symptoms continue in spite of treatment, as clinically indicated and appropriate.  If the patient was tested for COVID-19, Influenza and/or RSV, then the patient/parent/guardian was advised to isolate at home pending the results of his/her diagnostic coronavirus test and potentially longer if they're positive. I have also advised pt that if his/her COVID-19 test returns positive, it's recommended to self-isolate for at least 10 days after symptoms first appeared AND until fever-free for 24 hours without fever reducer AND other symptoms have improved or resolved. Discussed self-isolation recommendations as well as instructions for household member/close contacts as per the [facility] and [place] [facility], and also gave patient the COVID packet with this information.  Patient/parent/caregiver has been advised to return to the [facility] or PCP in 3-5 days if no better; to PCP or the Emergency Department if new signs and symptoms develop, or if the current signs or symptoms continue to change or worsen for further workup, evaluation and treatment as  clinically indicated and appropriate  The patient will follow  up with their current PCP if and as advised. If the patient does not currently have a PCP we will assist them in obtaining one.   The patient may need specialty follow up if the symptoms continue, in spite of conservative treatment and management, for further workup, evaluation, consultation and treatment as clinically indicated and appropriate.  Patient/parent/caregiver verbalized understanding and agreement of plan as discussed.  All questions were addressed during visit.  Please see discharge instructions below for further details of plan.  Discharge Instructions:   Discharge Instructions      Per the radiology report, your chest x-ray does not show any signs of pneumonia, pleural effusion or heart disease.  There was some mild scarring seen at both lung bases likely residual from your previous infection of bronchitis.  If you have continue to have pain in your left side, please reach out to your primary care provider to discuss 3D imaging with a CT scan of your chest.  This is a much better way to fully evaluate your lungs for presence of any signs of infection or lung disease.      This office note has been dictated using Teaching laboratory technician.  Unfortunately, this method of dictation can sometimes lead to typographical or grammatical errors.  I apologize for your inconvenience in advance if this occurs.  Please do not hesitate to reach out to me if clarification is needed.      Theadora Rama Scales, PA-C 01/25/22 1502

## 2022-01-25 NOTE — ED Triage Notes (Signed)
Pt c/o cough, SOB, pain under left breast (soreness) and congestion that started about about a month ago.   Home interventions: inhaler

## 2022-01-25 NOTE — Discharge Instructions (Signed)
Per the radiology report, your chest x-ray does not show any signs of pneumonia, pleural effusion or heart disease.  There was some mild scarring seen at both lung bases likely residual from your previous infection of bronchitis.  If you have continue to have pain in your left side, please reach out to your primary care provider to discuss 3D imaging with a CT scan of your chest.  This is a much better way to fully evaluate your lungs for presence of any signs of infection or lung disease.

## 2022-02-06 DIAGNOSIS — M0579 Rheumatoid arthritis with rheumatoid factor of multiple sites without organ or systems involvement: Secondary | ICD-10-CM | POA: Diagnosis not present

## 2022-04-13 DIAGNOSIS — M1991 Primary osteoarthritis, unspecified site: Secondary | ICD-10-CM | POA: Diagnosis not present

## 2022-04-13 DIAGNOSIS — M0579 Rheumatoid arthritis with rheumatoid factor of multiple sites without organ or systems involvement: Secondary | ICD-10-CM | POA: Diagnosis not present

## 2022-04-13 DIAGNOSIS — Z79899 Other long term (current) drug therapy: Secondary | ICD-10-CM | POA: Diagnosis not present

## 2022-07-25 DIAGNOSIS — M0579 Rheumatoid arthritis with rheumatoid factor of multiple sites without organ or systems involvement: Secondary | ICD-10-CM | POA: Diagnosis not present

## 2022-08-08 DIAGNOSIS — I10 Essential (primary) hypertension: Secondary | ICD-10-CM | POA: Diagnosis not present

## 2022-08-08 DIAGNOSIS — E7800 Pure hypercholesterolemia, unspecified: Secondary | ICD-10-CM | POA: Diagnosis not present

## 2022-10-30 DIAGNOSIS — Z79899 Other long term (current) drug therapy: Secondary | ICD-10-CM | POA: Diagnosis not present

## 2022-10-30 DIAGNOSIS — M0579 Rheumatoid arthritis with rheumatoid factor of multiple sites without organ or systems involvement: Secondary | ICD-10-CM | POA: Diagnosis not present

## 2022-10-30 DIAGNOSIS — M1991 Primary osteoarthritis, unspecified site: Secondary | ICD-10-CM | POA: Diagnosis not present

## 2023-01-18 DIAGNOSIS — Z01419 Encounter for gynecological examination (general) (routine) without abnormal findings: Secondary | ICD-10-CM | POA: Diagnosis not present

## 2023-01-18 DIAGNOSIS — Z1231 Encounter for screening mammogram for malignant neoplasm of breast: Secondary | ICD-10-CM | POA: Diagnosis not present

## 2023-01-31 DIAGNOSIS — M0579 Rheumatoid arthritis with rheumatoid factor of multiple sites without organ or systems involvement: Secondary | ICD-10-CM | POA: Diagnosis not present

## 2023-03-26 DIAGNOSIS — E7800 Pure hypercholesterolemia, unspecified: Secondary | ICD-10-CM | POA: Diagnosis not present

## 2023-03-26 DIAGNOSIS — Z0000 Encounter for general adult medical examination without abnormal findings: Secondary | ICD-10-CM | POA: Diagnosis not present

## 2023-03-26 DIAGNOSIS — Z23 Encounter for immunization: Secondary | ICD-10-CM | POA: Diagnosis not present

## 2023-04-24 DIAGNOSIS — J209 Acute bronchitis, unspecified: Secondary | ICD-10-CM | POA: Diagnosis not present

## 2023-04-27 DIAGNOSIS — M1991 Primary osteoarthritis, unspecified site: Secondary | ICD-10-CM | POA: Diagnosis not present

## 2023-04-27 DIAGNOSIS — M0579 Rheumatoid arthritis with rheumatoid factor of multiple sites without organ or systems involvement: Secondary | ICD-10-CM | POA: Diagnosis not present

## 2023-04-27 DIAGNOSIS — Z79899 Other long term (current) drug therapy: Secondary | ICD-10-CM | POA: Diagnosis not present

## 2023-07-30 DIAGNOSIS — M0579 Rheumatoid arthritis with rheumatoid factor of multiple sites without organ or systems involvement: Secondary | ICD-10-CM | POA: Diagnosis not present

## 2023-09-24 DIAGNOSIS — K219 Gastro-esophageal reflux disease without esophagitis: Secondary | ICD-10-CM | POA: Diagnosis not present

## 2023-09-24 DIAGNOSIS — I10 Essential (primary) hypertension: Secondary | ICD-10-CM | POA: Diagnosis not present

## 2023-09-27 DIAGNOSIS — Z1211 Encounter for screening for malignant neoplasm of colon: Secondary | ICD-10-CM | POA: Diagnosis not present

## 2023-10-25 DIAGNOSIS — Z79899 Other long term (current) drug therapy: Secondary | ICD-10-CM | POA: Diagnosis not present

## 2023-10-25 DIAGNOSIS — M0579 Rheumatoid arthritis with rheumatoid factor of multiple sites without organ or systems involvement: Secondary | ICD-10-CM | POA: Diagnosis not present

## 2023-10-25 DIAGNOSIS — M1991 Primary osteoarthritis, unspecified site: Secondary | ICD-10-CM | POA: Diagnosis not present

## 2023-12-25 DIAGNOSIS — I10 Essential (primary) hypertension: Secondary | ICD-10-CM | POA: Diagnosis not present

## 2024-01-29 DIAGNOSIS — M1991 Primary osteoarthritis, unspecified site: Secondary | ICD-10-CM | POA: Diagnosis not present

## 2024-01-29 DIAGNOSIS — R5383 Other fatigue: Secondary | ICD-10-CM | POA: Diagnosis not present

## 2024-01-29 DIAGNOSIS — Z79899 Other long term (current) drug therapy: Secondary | ICD-10-CM | POA: Diagnosis not present

## 2024-01-29 DIAGNOSIS — M0579 Rheumatoid arthritis with rheumatoid factor of multiple sites without organ or systems involvement: Secondary | ICD-10-CM | POA: Diagnosis not present

## 2024-02-13 DIAGNOSIS — Z01419 Encounter for gynecological examination (general) (routine) without abnormal findings: Secondary | ICD-10-CM | POA: Diagnosis not present

## 2024-02-13 DIAGNOSIS — Z1231 Encounter for screening mammogram for malignant neoplasm of breast: Secondary | ICD-10-CM | POA: Diagnosis not present

## 2024-03-17 DIAGNOSIS — I10 Essential (primary) hypertension: Secondary | ICD-10-CM | POA: Diagnosis not present

## 2024-03-17 DIAGNOSIS — J209 Acute bronchitis, unspecified: Secondary | ICD-10-CM | POA: Diagnosis not present

## 2024-03-17 DIAGNOSIS — M05741 Rheumatoid arthritis with rheumatoid factor of right hand without organ or systems involvement: Secondary | ICD-10-CM | POA: Diagnosis not present
